# Patient Record
Sex: Male | Born: 1995 | Race: White | Hispanic: No | Marital: Single | Smoking: Current some day smoker
Health system: Southern US, Community
[De-identification: ages and names within clinical notes are randomized; demographics above are authoritative.]

## PROBLEM LIST (undated history)

## (undated) HISTORY — PX: TONSILLECTOMY: SUR1361

---
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
## 2014-01-05 ENCOUNTER — Emergency Department: Payer: Self-pay | Admitting: Emergency Medicine

## 2015-04-09 ENCOUNTER — Emergency Department
Admission: EM | Admit: 2015-04-09 | Discharge: 2015-04-09 | Disposition: A | Discharge status: Home / Self Care | Payer: 59 | Attending: Emergency Medicine | Admitting: Emergency Medicine

## 2015-04-09 ENCOUNTER — Encounter: Payer: Self-pay | Admitting: Emergency Medicine

## 2015-04-09 DIAGNOSIS — Z720 Tobacco use: Secondary | ICD-10-CM | POA: Insufficient documentation

## 2015-04-09 DIAGNOSIS — Y998 Other external cause status: Secondary | ICD-10-CM | POA: Insufficient documentation

## 2015-04-09 DIAGNOSIS — Y9289 Other specified places as the place of occurrence of the external cause: Secondary | ICD-10-CM | POA: Insufficient documentation

## 2015-04-09 DIAGNOSIS — X58XXXA Exposure to other specified factors, initial encounter: Secondary | ICD-10-CM | POA: Insufficient documentation

## 2015-04-09 DIAGNOSIS — S39012A Strain of muscle, fascia and tendon of lower back, initial encounter: Secondary | ICD-10-CM | POA: Diagnosis not present

## 2015-04-09 DIAGNOSIS — S3992XA Unspecified injury of lower back, initial encounter: Secondary | ICD-10-CM | POA: Diagnosis present

## 2015-04-09 DIAGNOSIS — S60221A Contusion of right hand, initial encounter: Secondary | ICD-10-CM | POA: Insufficient documentation

## 2015-04-09 DIAGNOSIS — Y9389 Activity, other specified: Secondary | ICD-10-CM | POA: Diagnosis not present

## 2015-04-09 MED ORDER — CYCLOBENZAPRINE HCL 5 MG PO TABS: 5.0000 mg | ORAL_TABLET | Freq: Three times a day (TID) | ORAL | Status: DC | PRN

## 2015-04-09 MED ORDER — IBUPROFEN 800 MG PO TABS: 800.0000 mg | ORAL_TABLET | Freq: Three times a day (TID) | ORAL | Status: DC | PRN

## 2015-04-09 NOTE — ED Notes (Signed)
States his dog was chewing through dry wall and he was bending over to whip dog and hurt back and right hand

## 2015-04-09 NOTE — ED Provider Notes (Signed)
[facility] Emergency Department Provider Note  ____________________________________________  Time seen: Approximately 11:13 AM  I have reviewed the triage vital signs and the nursing notes.   HISTORY  Chief Complaint Back Pain   HPI Billy Schmitt is a 19 y.o. male who presents to the emergency department for evaluation of right mid to lower back pain and right hand pain. He states his dog chewed through some drywall and chewed up his Xbox while he was gone, which made him mad and he whipped the dog. He did not take anything for pain prior to coming to the emergency department he states that as soon as he felt the pain he just decided to come here.   History reviewed. No pertinent past medical history.  There are no active problems to display for this patient.   Past Surgical History  Procedure Laterality Date  . Tonsillectomy      Current Outpatient Rx  Name  Route  Sig  Dispense  Refill  . cyclobenzaprine (FLEXERIL) 5 MG tablet   Oral   Take 1 tablet (5 mg total) by mouth 3 (three) times daily as needed for muscle spasms.   30 tablet   0   . ibuprofen (ADVIL,MOTRIN) 800 MG tablet   Oral   Take 1 tablet (800 mg total) by mouth every 8 (eight) hours as needed.   30 tablet   0     Allergies Review of patient's allergies indicates no known allergies.  History reviewed. No pertinent family history.  Social History Social History  Substance Use Topics  . Smoking status: Current Some Day Smoker  . Smokeless tobacco: None  . Alcohol Use: No    Review of Systems Constitutional: No recent illness. Eyes: No visual changes. ENT: No sore throat. Cardiovascular: Denies chest pain or palpitations. Respiratory: Denies shortness of breath. Gastrointestinal: No abdominal pain.  Genitourinary: Negative for dysuria. Musculoskeletal: Pain in right hand and right lower back. Skin: Negative for rash. Neurological: Negative for headaches, focal  weakness or numbness. 10-point ROS otherwise negative.  ____________________________________________   PHYSICAL EXAM:  VITAL SIGNS: ED Triage Vitals  Enc Vitals Group     BP 04/09/15 1019 112/78 mmHg     Pulse Rate 04/09/15 1019 98     Resp 04/09/15 1019 18     Temp 04/09/15 1019 98.3 F (36.8 C)     Temp Source 04/09/15 1019 Oral     SpO2 04/09/15 1019 100 %     Weight 04/09/15 1019 140 lb (63.504 kg)     Height 04/09/15 1019  (1.702 m)     Head Cir --      Peak Flow --      Pain Score 04/09/15 1017 7     Pain Loc --      Pain Edu? --      Excl. in GC? --     Constitutional: Alert and oriented. Well appearing and in no acute distress. Eyes: Conjunctivae are normal. EOMI. Head: Atraumatic. Nose: No congestion/rhinnorhea. Neck: No stridor.  Respiratory: Normal respiratory effort.   Musculoskeletal: Full range of motion of right hand. No deformity noted. Tenderness over the right flank area with palpation. Neurologic:  Normal speech and language. No gross focal neurologic deficits are appreciated. Speech is normal. No gait instability. Skin:  Skin is warm, dry and intact. Atraumatic. Psychiatric: Mood and affect are normal. Speech and behavior are normal.  ____________________________________________   LABS (all labs ordered are listed, but only  abnormal results are displayed)  Labs Reviewed - No data to display ____________________________________________  RADIOLOGY  Not indicated ____________________________________________   PROCEDURES  Procedure(s) performed: None   ____________________________________________   INITIAL IMPRESSION / ASSESSMENT AND PLAN / ED COURSE  Pertinent labs & imaging results that were available during my care of the patient were reviewed by me and considered in my medical decision making (see chart for details).  Patient was advised to follow-up with orthopedics for symptoms that are not improving over the week. He was  advised to return to the emergency department for symptoms that change or worsen if he is unable schedule an appointment. ____________________________________________   FINAL CLINICAL IMPRESSION(S) / ED DIAGNOSES  Final diagnoses:  Low back strain, initial encounter  Hand contusion, right, initial encounter       Chinita Pester, FNP 04/09/15 1117  Governor Rooks, MD 04/09/15 1359

## 2015-10-15 ENCOUNTER — Encounter: Payer: Self-pay | Admitting: Emergency Medicine

## 2015-10-15 ENCOUNTER — Emergency Department
Admission: EM | Admit: 2015-10-15 | Discharge: 2015-10-15 | Disposition: A | Discharge status: Home / Self Care | Payer: 59 | Attending: Emergency Medicine | Admitting: Emergency Medicine

## 2015-10-15 DIAGNOSIS — Y998 Other external cause status: Secondary | ICD-10-CM | POA: Insufficient documentation

## 2015-10-15 DIAGNOSIS — S70361A Insect bite (nonvenomous), right thigh, initial encounter: Secondary | ICD-10-CM | POA: Insufficient documentation

## 2015-10-15 DIAGNOSIS — Y9289 Other specified places as the place of occurrence of the external cause: Secondary | ICD-10-CM | POA: Diagnosis not present

## 2015-10-15 DIAGNOSIS — F17200 Nicotine dependence, unspecified, uncomplicated: Secondary | ICD-10-CM | POA: Diagnosis not present

## 2015-10-15 DIAGNOSIS — W57XXXA Bitten or stung by nonvenomous insect and other nonvenomous arthropods, initial encounter: Secondary | ICD-10-CM | POA: Insufficient documentation

## 2015-10-15 DIAGNOSIS — Y9389 Activity, other specified: Secondary | ICD-10-CM | POA: Insufficient documentation

## 2015-10-15 MED ORDER — DOXYCYCLINE HYCLATE 100 MG PO TBEC: 100.0000 mg | DELAYED_RELEASE_TABLET | Freq: Two times a day (BID) | ORAL | Status: AC

## 2015-10-15 NOTE — ED Notes (Signed)
t to ed with c/o tick bite to upper right thigh,  Pt states he removed tick from leg about 4 days ago.  Pt now reports rash to upper right thigh.

## 2015-10-15 NOTE — ED Provider Notes (Signed)
[facility] Emergency Department Provider Note  ____________________________________________  Time seen: Approximately 1:55 PM  I have reviewed the triage vital signs and the nursing notes.   HISTORY  Chief Complaint Insect Bite    HPI Billy Schmitt is a 20 y.o. male , NAD, presents to the emergency department with rash and tick bite to the upper right thigh. States he was helping a friend clear some debris outside. States he removed 3 ticks that were mobile and unattached on the same day then removed and attached tick the morning after from the right upper thigh. Notes there was a large area of erythema approximately 6 inches surrounding the tick bite which is now decreased to her smaller 3 cm area of increased erythema. Denies pain, oozing, weeping, skin sores. Does have a punctate lesion centrally from where he removed the tick. States he removed the entire body and head of the tick from the area. Notes he did have sweats over the last 2 nights but denies fever, chills, body aches, nausea, vomiting, abdominal pain, visual changes, headache. Has noted no other rash about his body.   History reviewed. No pertinent past medical history.  There are no active problems to display for this patient.   Past Surgical History  Procedure Laterality Date  . Tonsillectomy      Current Outpatient Rx  Name  Route  Sig  Dispense  Refill  . cyclobenzaprine (FLEXERIL) 5 MG tablet   Oral   Take 1 tablet (5 mg total) by mouth 3 (three) times daily as needed for muscle spasms.   30 tablet   0   . doxycycline (DORYX) 100 MG EC tablet   Oral   Take 1 tablet (100 mg total) by mouth 2 (two) times daily.   20 tablet   0   . ibuprofen (ADVIL,MOTRIN) 800 MG tablet   Oral   Take 1 tablet (800 mg total) by mouth every 8 (eight) hours as needed.   30 tablet   0     Allergies Review of patient's allergies indicates no known allergies.  History reviewed. No pertinent  family history.  Social History Social History  Substance Use Topics  . Smoking status: Current Some Day Smoker  . Smokeless tobacco: None  . Alcohol Use: No     Review of Systems  Constitutional: Positive sweats. No fever/chills Cardiovascular: No chest pain. Respiratory: No cough. No shortness of breath. No wheezing.  Gastrointestinal: No abdominal pain.  No nausea, vomiting.   Musculoskeletal: Negative for myalgias.  Skin: Positive rash with central punctate lesion. Negative for skin sores or open wounds. Neurological: Negative for headaches, focal weakness or numbness. 10-point ROS otherwise negative.  ____________________________________________   PHYSICAL EXAM:  VITAL SIGNS: ED Triage Vitals  Enc Vitals Group     BP 10/15/15 1245 147/58 mmHg     Pulse Rate 10/15/15 1245 74     Resp 10/15/15 1245 20     Temp 10/15/15 1245 98.2 F (36.8 C)     Temp Source 10/15/15 1245 Oral     SpO2 10/15/15 1245 100 %     Weight 10/15/15 1245 135 lb (61.236 kg)     Height 10/15/15 1245  (1.778 m)     Head Cir --      Peak Flow --      Pain Score 10/15/15 1245 0     Pain Loc --      Pain Edu? --  Excl. in GC? --     Constitutional: Alert and oriented. Well appearing and in no acute distress. Eyes: Conjunctivae are normal. PERRL. EOMI without pain.  Head: Atraumatic.  Neck: Supple with full range of motion Hematological/Lymphatic/Immunilogical: No cervical lymphadenopathy. No inguinal lymphadenopathy. Cardiovascular: Normal rate, regular rhythm. Normal S1 and S2.  Good peripheral circulation. Respiratory: Normal respiratory effort without tachypnea or retractions. Lungs CTAB. Musculoskeletal: No lower extremity tenderness nor edema.   Neurologic:  Normal speech and language. No gross focal neurologic deficits are appreciated.  Skin:  Flat papular erythematous rash surrounding punctate lesion of the right proximal anterior thigh. Rash is nonblanching. Punctate  central lesion is scabbed over. No evidence of foreign body. No induration. No fluctuance, oozing, weeping.  Psychiatric: Mood and affect are normal. Speech and behavior are normal. Patient exhibits appropriate insight and judgement.   ____________________________________________   LABS  None  ___________________________________________  EKG  None ____________________________________________  RADIOLOGY  None  ____________________________________________    PROCEDURES  Procedure(s) performed: None    Medications - No data to display   ____________________________________________   INITIAL IMPRESSION / ASSESSMENT AND PLAN / ED COURSE  Patient's diagnosis is consistent with tick bite of right thigh with surrounding rash. Patient will be discharged home with prescriptions for doxycycline to take as directed to prophylactically treat for potential tick borne exposure/illness. Patient is to [facility] if symptoms persist past this treatment course. Patient is given ED precautions to return to the ED for any worsening or new symptoms.    ____________________________________________  FINAL CLINICAL IMPRESSION(S) / ED DIAGNOSES  Final diagnoses:  Tick bite of right thigh, initial encounter      NEW MEDICATIONS STARTED DURING THIS VISIT:  Discharge Medication List as of 10/15/2015  1:57 PM    START taking these medications   Details  doxycycline (DORYX) 100 MG EC tablet Take 1 tablet (100 mg total) by mouth 2 (two) times daily., Starting 10/15/2015, Until Tue 10/25/15, Print             Ernestene KielJami L MarrowstoneHagler, PA-C 10/15/15 1450  Sharman CheekPhillip Stafford, MD 10/15/15 [id number]

## 2015-10-15 NOTE — Discharge Instructions (Signed)
Tick Bite Information Ticks are insects that attach themselves to the skin and draw blood for food. There are various types of ticks. Common types include wood ticks and deer ticks. Most ticks live in shrubs and grassy areas. Ticks can climb onto your body when you make contact with leaves or grass where the tick is waiting. The most common places on the body for ticks to attach themselves are the scalp, neck, armpits, waist, and groin. Most tick bites are harmless, but sometimes ticks carry germs that cause diseases. These germs can be spread to a person during the tick's feeding process. The chance of a disease spreading through a tick bite depends on:   The type of tick.  Time of year.   How long the tick is attached.   Geographic location.  HOW CAN YOU PREVENT TICK BITES? Take these steps to help prevent tick bites when you are outdoors:  Wear protective clothing. Long sleeves and long pants are best.   Wear white clothes so you can see ticks more easily.  Tuck your pant legs into your socks.   If walking on a trail, stay in the middle of the trail to avoid brushing against bushes.  Avoid walking through areas with long grass.  Put insect repellent on all exposed skin and along boot tops, pant legs, and sleeve cuffs.   Check clothing, hair, and skin repeatedly and before going inside.   Brush off any ticks that are not attached.  Take a shower or bath as soon as possible after being outdoors.  WHAT IS THE PROPER WAY TO REMOVE A TICK? Ticks should be removed as soon as possible to help prevent diseases caused by tick bites. 1. If latex gloves are available, put them on before trying to remove a tick.  2. Using fine-point tweezers, grasp the tick as close to the skin as possible. You may also use curved forceps or a tick removal tool. Grasp the tick as close to its head as possible. Avoid grasping the tick on its body. 3. Pull gently with steady upward pressure until  the tick lets go. Do not twist the tick or jerk it suddenly. This may break off the tick's head or mouth parts. 4. Do not squeeze or crush the tick's body. This could force disease-carrying fluids from the tick into your body.  5. After the tick is removed, wash the bite area and your hands with soap and water or other disinfectant such as alcohol. 6. Apply a small amount of antiseptic cream or ointment to the bite site.  7. Wash and disinfect any instruments that were used.  Do not try to remove a tick by applying a hot match, petroleum jelly, or fingernail polish to the tick. These methods do not work and may increase the chances of disease being spread from the tick bite.  WHEN SHOULD YOU SEEK MEDICAL CARE? Contact your health care provider if you are unable to remove a tick from your skin or if a part of the tick breaks off and is stuck in the skin.  After a tick bite, you need to be aware of signs and symptoms that could be related to diseases spread by ticks. Contact your health care provider if you develop any of the following in the days or weeks after the tick bite:  Unexplained fever.  Rash. A circular rash that appears days or weeks after the tick bite may indicate the possibility of Lyme disease. The rash may resemble   a target with a bull's-eye and may occur at a different part of your body than the tick bite.  Redness and swelling in the area of the tick bite.   Tender, swollen lymph glands.   Diarrhea.   Weight loss.   Cough.   Fatigue.   Muscle, joint, or bone pain.   Abdominal pain.   Headache.   Lethargy or a change in your level of consciousness.  Difficulty walking or moving your legs.   Numbness in the legs.   Paralysis.  Shortness of breath.   Confusion.   Repeated vomiting.    This information is not intended to replace advice given to you by your health care provider. Make sure you discuss any questions you have with your health  care provider.   Document Released: 07/27/2000 Document Revised: 08/20/2014 Document Reviewed: 01/07/2013 Elsevier Interactive Patient Education 2016 Elsevier Inc.  

## 2016-01-01 ENCOUNTER — Encounter: Payer: Self-pay | Admitting: *Deleted

## 2016-01-01 ENCOUNTER — Emergency Department
Admission: EM | Admit: 2016-01-01 | Discharge: 2016-01-01 | Disposition: A | Discharge status: Home / Self Care | Payer: 59 | Attending: Emergency Medicine | Admitting: Emergency Medicine

## 2016-01-01 DIAGNOSIS — F17200 Nicotine dependence, unspecified, uncomplicated: Secondary | ICD-10-CM | POA: Diagnosis not present

## 2016-01-01 DIAGNOSIS — A6002 Herpesviral infection of other male genital organs: Secondary | ICD-10-CM | POA: Diagnosis not present

## 2016-01-01 DIAGNOSIS — A6000 Herpesviral infection of urogenital system, unspecified: Secondary | ICD-10-CM

## 2016-01-01 DIAGNOSIS — R21 Rash and other nonspecific skin eruption: Secondary | ICD-10-CM | POA: Diagnosis present

## 2016-01-01 LAB — URINALYSIS COMPLETE WITH MICROSCOPIC (ARMC ONLY)
Bilirubin Urine: NEGATIVE
GLUCOSE, UA: NEGATIVE mg/dL
Hgb urine dipstick: NEGATIVE
Ketones, ur: NEGATIVE mg/dL
Leukocytes, UA: NEGATIVE
Nitrite: NEGATIVE
PROTEIN: NEGATIVE mg/dL
SQUAMOUS EPITHELIAL / LPF: NONE SEEN
Specific Gravity, Urine: 1.014 (ref 1.005–1.030)
pH: 7 (ref 5.0–8.0)

## 2016-01-01 LAB — CHLAMYDIA/NGC RT PCR (ARMC ONLY)
CHLAMYDIA TR: NOT DETECTED
N gonorrhoeae: NOT DETECTED

## 2016-01-01 MED ORDER — LIDOCAINE HCL 2 % EX GEL
1.0000 "application " | Freq: Once | CUTANEOUS | Status: AC
  Administered 2016-01-01: 1 via TOPICAL

## 2016-01-01 MED ORDER — LIDOCAINE HCL 2 % EX GEL
CUTANEOUS | Status: AC
  Administered 2016-01-01: 1 via TOPICAL
  Filled 2016-01-01: qty 10

## 2016-01-01 MED ORDER — ACYCLOVIR 400 MG PO TABS: 400.0000 mg | ORAL_TABLET | Freq: Three times a day (TID) | ORAL | Status: AC

## 2016-01-01 MED ORDER — ACYCLOVIR 200 MG PO CAPS
400.0000 mg | ORAL_CAPSULE | Freq: Once | ORAL | Status: AC
  Administered 2016-01-01: 400 mg via ORAL
  Filled 2016-01-01: qty 2

## 2016-01-01 MED ORDER — LIDOCAINE 2 % EX GEL: 2.0000 mL | Freq: Two times a day (BID) | CUTANEOUS | Status: AC

## 2016-01-01 NOTE — Discharge Instructions (Signed)

## 2016-01-01 NOTE — ED Provider Notes (Signed)
[facility] Emergency Department Provider Note ____________________________________________  Time seen: [id number]  I have reviewed the triage vital signs and the nursing notes.  HISTORY  Chief Complaint  Rash  HPI Billy Schmitt is a 20 y.o. male visits to the ED for evaluation of painful sores to his penis the last 3-4 days. Patient describes discomfort about a day before the onset of the sores and lesions that he finds circumferentially around the distal shaft. He does admit to an unprotected single sexual encounter about 2 months prior. This was not with his current girlfriend. He had an apparent sexual encounter with his current girlfriend a day before the lesions appeared. He denies any interim fevers, chills, sweats.He reports some mild dysuria but denies any penile discharge, hematuria, or flank pain. He does note some tenderness to the groin bilaterally. He denies any previous STD exposure.  History reviewed. No pertinent past medical history.  There are no active problems to display for this patient.   Past Surgical History  Procedure Laterality Date  . Tonsillectomy      Current Outpatient Rx  Name  Route  Sig  Dispense  Refill  . acyclovir (ZOVIRAX) 400 MG tablet   Oral   Take 1 tablet (400 mg total) by mouth 3 (three) times daily.   30 tablet   0   . cyclobenzaprine (FLEXERIL) 5 MG tablet   Oral   Take 1 tablet (5 mg total) by mouth 3 (three) times daily as needed for muscle spasms.   30 tablet   0   . ibuprofen (ADVIL,MOTRIN) 800 MG tablet   Oral   Take 1 tablet (800 mg total) by mouth every 8 (eight) hours as needed.   30 tablet   0   . Lidocaine 2 % GEL   Apply externally   Apply 2 mLs topically 2 (two) times daily.   28.33 g   0    Allergies Review of patient's allergies indicates no known allergies.  No family history on file.  Social History Social History  Substance Use Topics  . Smoking status: Current Some Day Smoker   . Smokeless tobacco: None  . Alcohol Use: No   Review of Systems  Constitutional: Negative for fever.  Gastrointestinal: Negative for abdominal pain, vomiting and diarrhea. Genitourinary: Positive for dysuria. Penile lesions as above Musculoskeletal: Negative for back pain. Skin: Negative for rash. Neurological: Negative for headaches, focal weakness or numbness. ____________________________________________  PHYSICAL EXAM:  VITAL SIGNS: ED Triage Vitals  Enc Vitals Group     BP --      Pulse --      Resp --      Temp --      Temp src --      SpO2 --      Weight --      Height --      Head Cir --      Peak Flow --      Pain Score --      Pain Loc --      Pain Edu? --      Excl. in GC? --    Constitutional: Alert and oriented. Well appearing and in no distress. Head: Normocephalic and atraumatic. Hematological/Lymphatic/Immunological: Palpable inguinal lymphadenopathy, bilaterally. Respiratory: Normal respiratory effort.  Gastrointestinal: Soft and nontender. No distention. GU: Normal external genitalia. Circumcised male. Multiple ulcerated lesions on an erythematous base around the distal shaft, below the corona.  Musculoskeletal: Nontender with normal range of motion  in all extremities.  Skin:  Skin is warm, dry and intact. No rash noted. ____________________________________________   LABS (pertinent positives/negatives)  Labs Reviewed  URINALYSIS COMPLETEWITH MICROSCOPIC ([facility] ONLY) - Abnormal; Notable for the following:    Color, Urine YELLOW (*)    APPearance CLOUDY (*)    Bacteria, UA RARE (*)    All other components within normal limits  CHLAMYDIA/NGC RT PCR ([facility] ONLY)  ____________________________________________  PROCEDURES  Acyclovir 400 mg PO Viscous lidocaine 2% jelly topically ____________________________________________  INITIAL IMPRESSION / ASSESSMENT AND PLAN / ED COURSE  Patient with an initial outbreak of herpes genitalia.  He'll be discharged with prescriptions for acyclovir dose as directed. He is referred to the [facility] health Department STD clinic for further evaluation and testing including HIV testing. ____________________________________________  FINAL CLINICAL IMPRESSION(S) / ED DIAGNOSES  Final diagnoses:  Herpes genitalia     Lissa Hoard, PA-C 01/05/16 1735  Arnaldo Natal, MD 01/08/16 [phone]

## 2016-01-01 NOTE — ED Notes (Signed)
Patient states he noticed sores on his penis 3-4 days ago, reports some pain with urination. Patient reports having unprotected sex recently.

## 2016-01-01 NOTE — ED Notes (Signed)
Deferred physical assessment to PA. Patient appears anxious and uncomfortable.

## 2016-01-01 NOTE — ED Notes (Signed)
NAD noted at time of D/C. Pt denies questions or concerns. Pt ambulatory to the lobby at this time.  

## 2016-05-07 ENCOUNTER — Emergency Department
Admission: EM | Admit: 2016-05-07 | Discharge: 2016-05-07 | Disposition: A | Discharge status: Home / Self Care | Payer: 59 | Attending: Emergency Medicine | Admitting: Emergency Medicine

## 2016-05-07 ENCOUNTER — Encounter: Payer: Self-pay | Admitting: Emergency Medicine

## 2016-05-07 DIAGNOSIS — Y9389 Activity, other specified: Secondary | ICD-10-CM | POA: Insufficient documentation

## 2016-05-07 DIAGNOSIS — S0990XA Unspecified injury of head, initial encounter: Secondary | ICD-10-CM

## 2016-05-07 DIAGNOSIS — Y929 Unspecified place or not applicable: Secondary | ICD-10-CM | POA: Diagnosis not present

## 2016-05-07 DIAGNOSIS — F17200 Nicotine dependence, unspecified, uncomplicated: Secondary | ICD-10-CM | POA: Diagnosis not present

## 2016-05-07 DIAGNOSIS — W01198A Fall on same level from slipping, tripping and stumbling with subsequent striking against other object, initial encounter: Secondary | ICD-10-CM | POA: Diagnosis not present

## 2016-05-07 DIAGNOSIS — Y999 Unspecified external cause status: Secondary | ICD-10-CM | POA: Insufficient documentation

## 2016-05-07 DIAGNOSIS — S0191XA Laceration without foreign body of unspecified part of head, initial encounter: Secondary | ICD-10-CM | POA: Insufficient documentation

## 2016-05-07 DIAGNOSIS — Z791 Long term (current) use of non-steroidal anti-inflammatories (NSAID): Secondary | ICD-10-CM | POA: Diagnosis not present

## 2016-05-07 NOTE — ED Provider Notes (Signed)
[facility] Emergency Department Provider Note  ____________________________________________  Time seen: 1720  I have reviewed the triage vital signs and the nursing notes.   HISTORY  Chief Complaint Head Injury  HPI Billy Schmitt is a 20 y.o. male who presents to the emergency department for evaluation of a head injury he sustained while at work. 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 He denies loss of consciousness. He states that he did not actually strike his head on the floor.   History reviewed. No pertinent past medical history.  There are no active problems to display for this patient.   Past Surgical History:  Procedure Laterality Date  . TONSILLECTOMY      Current Outpatient Rx  . Order #: [id number] Class: Print  . Order #: [id number] Class: Print    Allergies Review of patient's allergies indicates no known allergies.  No family history on file.  Social History Social History  Substance Use Topics  . Smoking status: Current Some Day Smoker  . Smokeless tobacco: Never Used  . Alcohol use No    Review of Systems  Constitutional: No loss of consciousness, no fever. HEENT: Negative for decrease in hearing, negative for epistaxis, negative for change in vision. Musculoskeletal: Negative for neck or back pain. Negative for extremity pain. Skin: Positive for laceration behind the left ear. ____________________________________________   PHYSICAL EXAM:  Constitutional: Well appearing and in no distress. HEENT: Head is atraumatic with the exception of small laceration behind the left ear: Bilateral tympanic membranes are within normal limits. No epistaxis. Musculoskeletal: Nexus criteria is negative. Full range of motion of extremities. No midline tenderness throughout the spine. Skin: 1 cm laceration behind the left ear. Bleeding well  controlled.  VITAL SIGNS: ED Triage Vitals  Enc Vitals Group     BP 05/07/16 1648 (!) 115/58     Pulse Rate 05/07/16 1648 (!) 57     Resp 05/07/16 1648 18     Temp 05/07/16 1648 98.1 F (36.7 C)     Temp Source 05/07/16 1648 Oral     SpO2 05/07/16 1648 98 %     Weight 05/07/16 1649 135 lb (61.2 kg)     Height 05/07/16 1649 5\' 9"  (1.753 m)     Head Circumference --      Peak Flow --      Pain Score 05/07/16 1658 4     Pain Loc --      Pain Edu? --      Excl. in GC? --      ____________________________________________   EKG    ____________________________________________    RADIOLOGY  Not indicated  ____________________________________________   PROCEDURES  Procedure(s) performed:  LACERATION REPAIR Performed by: Kem Boroughs  Consent: Verbal consent obtained.  Consent given by: patient  Prepped and Draped in normal sterile fashion  Wound explored: No foreign bodies  Laceration Location: Near skin fold of left ear  Laceration Length: 1 cm  Anesthesia: None  Amount of cleaning: Standard  Skin closure: Skin adhesive  Technique: Topical  Patient tolerance: Patient tolerated the procedure well with no immediate complications. __________________________________________   INITIAL IMPRESSION / ASSESSMENT AND PLAN / ED COURSE  Pertinent labs & imaging results that were available during my care of the patient were reviewed by me and considered in my medical decision making (see chart for details).  Wound care  instruction discussed. Head injury instructions discussed.  Patient was advised to follow up with the PCP of his choice for concerns or return to the emergency department.  ____________________________________________   FINAL CLINICAL IMPRESSION(S) / ED DIAGNOSES  Final diagnoses:  Minor head injury, initial encounter  Laceration of head, initial encounter      Chinita PesterCari B Dalisha Shively, FNP 05/07/16 2215    Loleta Roseory Forbach, MD 05/07/16  2320

## 2016-05-07 NOTE — ED Triage Notes (Signed)
Slipped on wet floor at work, hit head on faucet.  Noticed small amount of bleeding.  Here to get checked out.  Denies LOC

## 2016-11-19 ENCOUNTER — Emergency Department
Admission: EM | Admit: 2016-11-19 | Discharge: 2016-11-19 | Disposition: A | Discharge status: Home / Self Care | Payer: 59 | Attending: Emergency Medicine | Admitting: Emergency Medicine

## 2016-11-19 DIAGNOSIS — Y939 Activity, unspecified: Secondary | ICD-10-CM | POA: Insufficient documentation

## 2016-11-19 DIAGNOSIS — Y929 Unspecified place or not applicable: Secondary | ICD-10-CM | POA: Insufficient documentation

## 2016-11-19 DIAGNOSIS — W57XXXA Bitten or stung by nonvenomous insect and other nonvenomous arthropods, initial encounter: Secondary | ICD-10-CM | POA: Insufficient documentation

## 2016-11-19 DIAGNOSIS — Y999 Unspecified external cause status: Secondary | ICD-10-CM | POA: Insufficient documentation

## 2016-11-19 DIAGNOSIS — T07XXXA Unspecified multiple injuries, initial encounter: Secondary | ICD-10-CM | POA: Insufficient documentation

## 2016-11-19 DIAGNOSIS — F17200 Nicotine dependence, unspecified, uncomplicated: Secondary | ICD-10-CM | POA: Insufficient documentation

## 2016-11-19 MED ORDER — DOXYCYCLINE HYCLATE 100 MG PO CAPS: 100.0000 mg | ORAL_CAPSULE | Freq: Two times a day (BID) | ORAL | 0 refills | Status: DC

## 2016-11-19 NOTE — ED Triage Notes (Signed)
Pt states he was [place] in the woods on Friday and over the course of 24hrs he removed 14 ticks, states since yesterday is having nausea with ringing in the ears.Billy Schmitt pain or HA, fever or rash.

## 2016-11-19 NOTE — ED Provider Notes (Signed)
[facility] Emergency Department Provider Note  ____________________________________________   First MD Initiated Contact with Patient 11/19/16 1518     (approximate)  I have reviewed the triage vital signs and the nursing notes.   HISTORY  Chief Complaint Insect Bite    HPI Billy Schmitt is a 21 y.o. male is here after pulling approximate 14 takes off of his body in last 24 hours. Patient states last year he also had multiple tick bites. He was placed on doxycycline without any difficulty. Currently he states he had nausea yesterday with ringing in his ears. He denies any fever or chills. He denies any headache or muscle aches at this time. Patient had tick bites last year and reports that he had a bull's-eye last year and was placed on doxycycline.   History reviewed. No pertinent past medical history.  There are no active problems to display for this patient.   Past Surgical History:  Procedure Laterality Date  . TONSILLECTOMY      Prior to Admission medications   Medication Sig Start Date End Date Taking? Authorizing Provider  doxycycline (VIBRAMYCIN) 100 MG capsule Take 1 capsule (100 mg total) by mouth 2 (two) times daily. 11/19/16   Tommi Rumps, PA-C    Allergies Patient has no known allergies.  No family history on file.  Social History Social History  Substance Use Topics  . Smoking status: Current Some Day Smoker  . Smokeless tobacco: Never Used  . Alcohol use No    Review of Systems Constitutional: No fever/chills ENT: No sore throat. Positive ringing in the ears. Cardiovascular: Denies chest pain. Respiratory: Denies shortness of breath. Gastrointestinal: No abdominal pain.  No nausea, no vomiting.   Musculoskeletal: Negative for back pain. Skin: Positive for multiple tick bites. Neurological: Negative for headaches, focal weakness or numbness.  10-point ROS otherwise  negative.  ____________________________________________   PHYSICAL EXAM:  VITAL SIGNS: ED Triage Vitals [11/19/16 1442]  Enc Vitals Group     BP 139/88     Pulse Rate 77     Resp 18     Temp 98.3 F (36.8 C)     Temp Source Oral     SpO2 99 %     Weight 130 lb (59 kg)     Height  (1.778 m)     Head Circumference      Peak Flow      Pain Score      Pain Loc      Pain Edu?      Excl. in GC?     Constitutional: Alert and oriented. Well appearing and in no acute distress. Eyes: Conjunctivae are normal. PERRL. EOMI. Head: Atraumatic. Nose: No congestion/rhinnorhea.  EAC's with cerumen. Neck: No stridor.   Hematological/Lymphatic/Immunilogical: No cervical lymphadenopathy. Cardiovascular: Normal rate, regular rhythm. Grossly normal heart sounds.  Good peripheral circulation. Respiratory: Normal respiratory effort.  No retractions. Lungs CTAB. Musculoskeletal: His upper and lower extremities without any difficulty. No edema noted. Neurologic:  Normal speech and language. No gross focal neurologic deficits are appreciated. No gait instability. Skin:  Skin is warm, dry and intact. Multiple tick bites with erythema noted but no actual bull's-eye is seen. Psychiatric: Mood and affect are normal. Speech and behavior are normal.  ____________________________________________   LABS (all labs ordered are listed, but only abnormal results are displayed)  Labs Reviewed - No data to display  PROCEDURES  Procedure(s) performed: None  Procedures  Critical Care performed: No  ____________________________________________   INITIAL IMPRESSION / ASSESSMENT AND PLAN / ED COURSE  Pertinent labs & imaging results that were available during my care of the patient were reviewed by me and considered in my medical decision making (see chart for details).  Patient does not have PCP to follow-up with. He was given a prescription for doxycycline. He is to follow-up with [facility]  clinic acute-care if any problems. Patient is aware that he should not be out in bright sunlight because of the antibiotic and possibility of blistering.      ____________________________________________   FINAL CLINICAL IMPRESSION(S) / ED DIAGNOSES  Final diagnoses:  Tick bite, initial encounter      NEW MEDICATIONS STARTED DURING THIS VISIT:  Discharge Medication List as of 11/19/2016  3:45 PM    START taking these medications   Details  doxycycline (VIBRAMYCIN) 100 MG capsule Take 1 capsule (100 mg total) by mouth 2 (two) times daily., Starting Mon 11/19/2016, Print         Note:  This document was prepared using Dragon voice recognition software and may include unintentional dictation errors.    Tommi Rumps, PA-C 11/19/16 1555    Tommi Rumps, PA-C 11/19/16 1556    Emily Filbert, MD 11/20/16 [phone]

## 2016-11-19 NOTE — ED Notes (Signed)
See triage note  States he found several ticks on him a few days ago  Now having ringing in ears and some nausea  Afebrile on arrival

## 2016-11-19 NOTE — Discharge Instructions (Signed)
Follow-up with [facility] clinic acute-care if any continued problems. Begin taking doxycycline as directed twice a day for the next 7 days. Be aware that you cannot be out in bright sunlight with this medication. Consider wearing Deet to discourage ticks .

## 2018-02-07 ENCOUNTER — Emergency Department
Admission: EM | Admit: 2018-02-07 | Discharge: 2018-02-08 | Disposition: A | Discharge status: Home / Self Care | Payer: 59 | Attending: Emergency Medicine | Admitting: Emergency Medicine

## 2018-02-07 ENCOUNTER — Other Ambulatory Visit: Payer: Self-pay

## 2018-02-07 DIAGNOSIS — L03031 Cellulitis of right toe: Secondary | ICD-10-CM | POA: Insufficient documentation

## 2018-02-07 DIAGNOSIS — F17200 Nicotine dependence, unspecified, uncomplicated: Secondary | ICD-10-CM | POA: Insufficient documentation

## 2018-02-07 MED ORDER — CEPHALEXIN 500 MG PO CAPS
500.0000 mg | ORAL_CAPSULE | Freq: Once | ORAL | Status: DC
  Filled 2018-02-07: qty 1

## 2018-02-07 MED ORDER — CEPHALEXIN 500 MG PO CAPS: 500.0000 mg | ORAL_CAPSULE | Freq: Two times a day (BID) | ORAL | 0 refills | Status: AC

## 2018-02-07 NOTE — ED Triage Notes (Signed)
Pt arrives to ED via POV with c/o ingrown toenail (1st toe on right foot) x3 days. Pt reports some discharge when he tried to express it with a toothpick.

## 2018-02-07 NOTE — ED Provider Notes (Signed)
[facility] Emergency Department Provider Note    First MD Initiated Contact with Patient 02/07/18 2329     (approximate)  I have reviewed the triage vital signs and the nursing notes.   HISTORY  Chief Complaint Ingrown Toenail    HPI Billy Schmitt is a 22 y.o. male presents to the emergency department with a 3-day history of right great toe "ingrown toenail with associated discomfort and scant purulent drainage.  Patient states that he attempted to express this drainage with a toothpick.  Past medical history None  There are no active problems to display for this patient.   Past Surgical History:  Procedure Laterality Date  . TONSILLECTOMY      Prior to Admission medications   Medication Sig Start Date End Date Taking? Authorizing Provider  doxycycline (VIBRAMYCIN) 100 MG capsule Take 1 capsule (100 mg total) by mouth 2 (two) times daily. 11/19/16   Tommi Rumps, PA-C    Allergies No known drug allergies No family history on file.  Social History Social History   Tobacco Use  . Smoking status: Current Some Day Smoker  . Smokeless tobacco: Never Used  Substance Use Topics  . Alcohol use: No  . Drug use: No    Review of Systems Constitutional: No fever/chills Eyes: No visual changes. ENT: No sore throat. Cardiovascular: Denies chest pain. Respiratory: Denies shortness of breath. Gastrointestinal: No abdominal pain.  No nausea, no vomiting.  No diarrhea.  No constipation. Genitourinary: Negative for dysuria. Musculoskeletal: Negative for neck pain.  Negative for back pain. Integumentary: Negative for rash. Neurological: Negative for headaches, focal weakness or numbness.   ____________________________________________   PHYSICAL EXAM:  VITAL SIGNS: ED Triage Vitals  Enc Vitals Group     BP 02/07/18 2216 112/62     Pulse Rate 02/07/18 2216 67     Resp 02/07/18 2216 17     Temp 02/07/18 2216 98.3 F (36.8 C)   Temp Source 02/07/18 2216 Oral     SpO2 02/07/18 2216 98 %     Weight 02/07/18 2215 56.7 kg (125 lb)     Height 02/07/18 2215 1.753 m (5\' 9" )     Head Circumference --      Peak Flow --      Pain Score 02/07/18 2214 6     Pain Loc --      Pain Edu? --      Excl. in GC? --     Constitutional: Alert and oriented. Well appearing and in no acute distress. Eyes: Conjunctivae are normal.  Mouth/Throat: Mucous membranes are moist Neck: No stridor.   Musculoskeletal: Blanching erythema noted at the nailbed of the right great toe.  No flocculence.  No active drainage. Neurologic:  Normal speech and language. No gross focal neurologic deficits are appreciated.  Skin: Blanching erythema at the base of the right great toe with no associated flocculence or drainage. Psychiatric: Mood and affect are normal. Speech and behavior are normal.  ____________________________________________    __________________  Procedures   ____________________________________________   INITIAL IMPRESSION / ASSESSMENT AND PLAN / ED COURSE  As part of my medical decision making, I reviewed the following data within the electronic MEDICAL RECORD NUMBER  22 year old male presenting with above-stated history and physical exam consistent with a paronychia.  Patient given Keflex in the emergency department will be prescribed the same for home no need for I&D at this time.   ____________________________________________  FINAL CLINICAL IMPRESSION(S) / ED DIAGNOSES  Final diagnoses:  Paronychia of great toe, right     MEDICATIONS GIVEN DURING THIS VISIT:  Medications - No data to display   ED Discharge Orders    None       Note:  This document was prepared using Dragon voice recognition software and may include unintentional dictation errors.    Darci CurrentBrown, [place], MD 02/07/18 [phone]

## 2018-02-08 NOTE — ED Notes (Signed)
RN to bedside to discharge patient. Patient not in room. Patient not in lobby. Patient left prior to reviewing discharge instructions with RN or receiving prescription. Patient had previously discussed discharge instructions with MD Manson PasseyBrown. Patient verbalized understanding of instructions left with MD Manson PasseyBrown.

## 2018-02-08 NOTE — ED Notes (Signed)
RN attempted to call patient unsuccessfully. Patient's voicemail box not set up. No message left. Patient's discharge paper's and prescription left at front desk.

## 2018-03-18 LAB — HM HIV SCREENING LAB: HM HIV Screening: NEGATIVE

## 2019-06-05 ENCOUNTER — Ambulatory Visit: Payer: Self-pay

## 2019-06-23 ENCOUNTER — Encounter: Payer: Self-pay | Admitting: Family Medicine

## 2019-06-23 ENCOUNTER — Ambulatory Visit: Payer: Self-pay | Admitting: Family Medicine

## 2019-06-23 ENCOUNTER — Other Ambulatory Visit: Payer: Self-pay

## 2019-06-23 DIAGNOSIS — N489 Disorder of penis, unspecified: Secondary | ICD-10-CM

## 2019-06-23 DIAGNOSIS — L042 Acute lymphadenitis of upper limb: Secondary | ICD-10-CM

## 2019-06-23 DIAGNOSIS — Z113 Encounter for screening for infections with a predominantly sexual mode of transmission: Secondary | ICD-10-CM

## 2019-06-23 LAB — GRAM STAIN

## 2019-06-23 NOTE — Progress Notes (Signed)
Gram stain reviewed. No tx per SO Billy Mester, RN  

## 2019-06-23 NOTE — Progress Notes (Signed)
    STI clinic/screening visit  Subjective:  Billy Schmitt is a 23 y.o. male being seen today for an STI screening visit. The patient reports they do have symptoms.  Patient has the following medical conditions:  There are no active problems to display for this patient.    No chief complaint on file.   HPI  Patient reports that he has a small bump on his penis that he notice prior to his 11/2018 clinic visit.  States he was told it wasn't HPV and that it should resolve without treatment.  He states that the lesion hasn't changed and wants to be reevluated.  He has had a fever blister on his lower lip that is resolving.  He also has a swollen, tender lymph node under his R axilla x 3-4 day. He believes it is smaller today than when he noted area 3-4 days ago.Marland Kitchen  He took ibuprofen yesterday. Denies fever. Discharge, or redness of area or injuiry.   See flowsheet for further details and programmatic requirements.    The following portions of the patient's history were reviewed and updated as appropriate: allergies, current medications, past medical history, past social history, past surgical history and problem list.  Objective:  There were no vitals filed for this visit.  Physical Exam Constitutional:      Appearance: He is obese.  HENT:     Mouth/Throat:     Mouth: Mucous membranes are moist. Oral lesions present.     Pharynx: Oropharynx is clear. No oropharyngeal exudate or posterior oropharyngeal erythema.     Comments: sm healing area on L lower lip, no disch./redness noted Neck:     Musculoskeletal: Neck supple. No muscular tenderness.  Abdominal:     Palpations: Abdomen is soft.     Tenderness: There is no abdominal tenderness.  Genitourinary:    Penis: Circumcised. No lesions.      Comments: 1-2 mm flesh colored lesion, nontender Lymphadenopathy:     Cervical: No cervical adenopathy.     Upper Body:     Right upper body: Axillary adenopathy present.     Left upper  body: No axillary adenopathy.     Comments: 2 cm x 3 cm fluctuant, mobile mass , non-erythematous, mild tenderness on palpation  Skin:    General: Skin is dry.     Findings: No lesion or rash.  Neurological:     Mental Status: He is alert.    Assessment and Plan:  JODY SILAS is a 23 y.o. male presenting to the [facility] Department for STI screening  1. Screening examination for venereal disease  - Gram stain- negativer - Gonococcus culture - HIV/HCV [place] [facility] - Hepatitis Serology, [place] [facility] - Syphilis Serology, [place] [facility]  2. Abnormality of penis Most likely molluscum- client requested freezing treatment. - Cryotherapy/destruct benign or premalignant lesion Co client to return to clinic for further evaluation as needed.  3.  Acute lymphadenitis of R axilla Co. To take Ibuprofen 600-800 mg po q 8 hrs. And apply warn compress to area.  IF no change in are 48- 72 hours to seek care at urgent care or ED.  No follow-ups on file.  No future appointments.  Hassell Done, FNP

## 2019-06-27 LAB — GONOCOCCUS CULTURE

## 2019-06-28 ENCOUNTER — Other Ambulatory Visit: Payer: Self-pay

## 2019-06-28 ENCOUNTER — Emergency Department: Admission: EM | Admit: 2019-06-28 | Discharge: 2019-06-28 | Discharge status: Against Medical Advice | Payer: Self-pay

## 2019-06-30 LAB — HEPATITIS B SURFACE ANTIGEN

## 2019-07-02 ENCOUNTER — Other Ambulatory Visit: Payer: Self-pay

## 2019-07-02 ENCOUNTER — Emergency Department
Admission: EM | Admit: 2019-07-02 | Discharge: 2019-07-02 | Disposition: A | Discharge status: Home / Self Care | Payer: Self-pay | Attending: Emergency Medicine | Admitting: Emergency Medicine

## 2019-07-02 DIAGNOSIS — F17290 Nicotine dependence, other tobacco product, uncomplicated: Secondary | ICD-10-CM | POA: Insufficient documentation

## 2019-07-02 DIAGNOSIS — L723 Sebaceous cyst: Secondary | ICD-10-CM | POA: Insufficient documentation

## 2019-07-02 LAB — CBC WITH DIFFERENTIAL/PLATELET
Abs Immature Granulocytes: 0.01 10*3/uL (ref 0.00–0.07)
Basophils Absolute: 0.1 10*3/uL (ref 0.0–0.1)
Basophils Relative: 2 %
Eosinophils Absolute: 0.3 10*3/uL (ref 0.0–0.5)
Eosinophils Relative: 4 %
HCT: 44.1 % (ref 39.0–52.0)
Hemoglobin: 15.6 g/dL (ref 13.0–17.0)
Immature Granulocytes: 0 %
Lymphocytes Relative: 42 %
Lymphs Abs: 2.6 10*3/uL (ref 0.7–4.0)
MCH: 29.4 pg (ref 26.0–34.0)
MCHC: 35.4 g/dL (ref 30.0–36.0)
MCV: 83.1 fL (ref 80.0–100.0)
Monocytes Absolute: 0.5 10*3/uL (ref 0.1–1.0)
Monocytes Relative: 7 %
Neutro Abs: 2.7 10*3/uL (ref 1.7–7.7)
Neutrophils Relative %: 45 %
Platelets: 199 10*3/uL (ref 150–400)
RBC: 5.31 MIL/uL (ref 4.22–5.81)
RDW: 12.3 % (ref 11.5–15.5)
WBC: 6.1 10*3/uL (ref 4.0–10.5)
nRBC: 0 % (ref 0.0–0.2)

## 2019-07-02 LAB — BASIC METABOLIC PANEL
Anion gap: 11 (ref 5–15)
BUN: 11 mg/dL (ref 6–20)
CO2: 22 mmol/L (ref 22–32)
Calcium: 9.3 mg/dL (ref 8.9–10.3)
Chloride: 107 mmol/L (ref 98–111)
Creatinine, Ser: 0.88 mg/dL (ref 0.61–1.24)
GFR calc Af Amer: >60 mL/min (ref >60)
GFR calc non Af Amer: >60 mL/min (ref >60)
Glucose, Bld: 102 mg/dL — ABNORMAL HIGH (ref 70–99)
Potassium: 4.4 mmol/L (ref 3.5–5.1)
Sodium: 140 mmol/L (ref 135–145)

## 2019-07-02 NOTE — Discharge Instructions (Addendum)
Follow-up with the surgeon listed on your discharge papers if any continued problems or desire to have it completely removed.  If this area becomes red, hot, painful to touch return to the ED where it can be lanced.

## 2019-07-02 NOTE — ED Notes (Signed)
See triage note  Presents with swelling under right arm for the past 2 weeks

## 2019-07-02 NOTE — ED Provider Notes (Signed)
[facility] Emergency Department Provider Note  ____________________________________________   First MD Initiated Contact with Patient 07/02/19 [phone]     (approximate)  I have reviewed the triage vital signs and the nursing notes.   HISTORY  Chief Complaint Abscess   HPI Billy Schmitt is a 23 y.o. male presents to the ED with complaint of a swollen area under his right arm for the past 2 weeks.  He states he was seen at the health department when he was having STI test and was told that if it did not improve to go to the ED for evaluation.  Patient denies any discoloration, fever, tenderness or drainage.  He rates his pain as a 0/10 at this time.      History reviewed. No pertinent past medical history.  There are no active problems to display for this patient.   Past Surgical History:  Procedure Laterality Date  . TONSILLECTOMY      Prior to Admission medications   Not on File    Allergies Patient has no known allergies.  No family history on file.  Social History Social History   Tobacco Use  . Smoking status: Current Every Day Smoker    Types: E-cigarettes  . Smokeless tobacco: Never Used  Substance Use Topics  . Alcohol use: Yes    Comment: occas.  . Drug use: Yes    Types: Marijuana    Comment: most days    Review of Systems Constitutional: No fever/chills Eyes: No visual changes. ENT: No sore throat. Cardiovascular: Denies chest pain. Respiratory: Denies shortness of breath. Gastrointestinal: No abdominal pain.  No nausea, no vomiting. Musculoskeletal: Negative for back pain. Skin: Positive for "cyst". Neurological: Negative for headaches, focal weakness or numbness.  ____________________________________________   PHYSICAL EXAM:  VITAL SIGNS: ED Triage Vitals  Enc Vitals Group     BP 07/02/19 0801 127/75     Pulse Rate 07/02/19 0801 70     Resp 07/02/19 0801 16     Temp 07/02/19 0801 98.4 F (36.9 C)   Temp Source 07/02/19 0801 Oral     SpO2 07/02/19 0801 99 %     Weight 07/02/19 0758 140 lb (63.5 kg)     Height 07/02/19 0758 5\' 8"  (1.727 m)     Head Circumference --      Peak Flow --      Pain Score 07/02/19 0758 0     Pain Loc --      Pain Edu? --      Excl. in [place]? --    Constitutional: Alert and oriented. Well appearing and in no acute distress. Eyes: Conjunctivae are normal.  Head: Atraumatic. Neck: No stridor.   Cardiovascular: Normal rate, regular rhythm. Grossly normal heart sounds.  Good peripheral circulation. Respiratory: Normal respiratory effort.  No retractions. Lungs CTAB. Musculoskeletal: No lower extremity tenderness nor edema.  No joint effusions. Neurologic:  Normal speech and language. No gross focal neurologic deficits are appreciated. No gait instability. Skin:  Skin is warm, dry and intact.  2 cm flesh-colored, round, discrete margins and nontender is mobile in the right axilla.  No drainage present.  Skin is intact. Psychiatric: Mood and affect are normal. Speech and behavior are normal.  ____________________________________________   LABS (all labs ordered are listed, but only abnormal results are displayed)  Labs Reviewed  BASIC METABOLIC PANEL - Abnormal; Notable for the following components:      Result Value   Glucose, Bld 102 (*)  All other components within normal limits  CBC WITH DIFFERENTIAL/PLATELET     PROCEDURES  Procedure(s) performed (including Critical Care):  Procedures   ____________________________________________   INITIAL IMPRESSION / ASSESSMENT AND PLAN / ED COURSE  As part of my medical decision making, I reviewed the following data within the electronic MEDICAL RECORD NUMBER Notes from prior ED visits and [place] Controlled Substance Database  23 year old male presents to the ED with questionable cyst in his right axilla for the last 2 weeks.  Patient states that it is not changed in color and has not been tender, warm or  draining.  On exam there is a flesh-colored cystic formation that is mobile, nontender and without discoloration.  Patient was made aware that this was a sebaceous cyst most likely but not infected at this time.  He was given information about surgical consult should he wish to have this completely removed.  At this time there is nothing to I&D.  He is to follow-up with his PCP if any continued problems.  ____________________________________________   FINAL CLINICAL IMPRESSION(S) / ED DIAGNOSES  Final diagnoses:  Sebaceous cyst of right axilla     ED Discharge Orders    None       Note:  This document was prepared using Dragon voice recognition software and may include unintentional dictation errors.    Tommi Rumps, PA-C 07/02/19 1559    Sharyn Creamer, MD 07/02/19 [phone]

## 2019-07-02 NOTE — ED Triage Notes (Signed)
Pt c/o swollen tender area to the right axillary area for the past 2 weeks.

## 2019-07-03 LAB — HM HIV SCREENING LAB: HM HIV Screening: NEGATIVE

## 2019-07-03 LAB — HM HEPATITIS C SCREENING LAB: HM Hepatitis Screen: NEGATIVE

## 2019-07-06 ENCOUNTER — Encounter: Payer: Self-pay | Admitting: Emergency Medicine

## 2019-07-06 ENCOUNTER — Ambulatory Visit
Admission: EM | Admit: 2019-07-06 | Discharge: 2019-07-06 | Disposition: A | Discharge status: Home / Self Care | Payer: Self-pay | Attending: Emergency Medicine | Admitting: Emergency Medicine

## 2019-07-06 DIAGNOSIS — J029 Acute pharyngitis, unspecified: Secondary | ICD-10-CM | POA: Insufficient documentation

## 2019-07-06 LAB — POCT RAPID STREP A (OFFICE): Rapid Strep A Screen: NEGATIVE

## 2019-07-06 MED ORDER — IBUPROFEN 800 MG PO TABS: 800.0000 mg | ORAL_TABLET | Freq: Three times a day (TID) | ORAL | 0 refills | Status: DC | PRN

## 2019-07-06 NOTE — Discharge Instructions (Addendum)
Take the ibuprofen as directed.  Go to the emergency department if you have difficulty swallowing or breathing.    Your rapid strep test is negative.  A throat culture is pending; we will call you if it is positive requiring treatment.    Your COVID test is pending.  You should self quarantine until your test result is back and is negative.    Go to the emergency department if you develop high fever, shortness of breath, severe diarrhea, or other concerning symptoms.

## 2019-07-06 NOTE — ED Provider Notes (Signed)
Roderic Palau    CSN: [id number] Arrival date & time: 07/06/19  1344      History   Chief Complaint Chief Complaint  Patient presents with  . Covid test    HPI OJANI BERENSON is a 23 y.o. male.   Patient presents with "uncomfortable feeling" in his throat.  He states it feels like it is swollen.  He denies difficulty swallowing or breathing.  He denies fever, chills, cough, shortness of breath, rash or other symptoms.  No treatments attempted at home.  Patient requests a COVID test.  The history is provided by the patient.    History reviewed. No pertinent past medical history.  There are no active problems to display for this patient.   Past Surgical History:  Procedure Laterality Date  . TONSILLECTOMY         Home Medications    Prior to Admission medications   Medication Sig Start Date End Date Taking? Authorizing Provider  ibuprofen (ADVIL) 800 MG tablet Take 1 tablet (800 mg total) by mouth every 8 (eight) hours as needed. 07/06/19   Sharion Balloon, NP    Family History History reviewed. No pertinent family history.  Social History Social History   Tobacco Use  . Smoking status: Current Every Day Smoker    Types: E-cigarettes  . Smokeless tobacco: Never Used  Substance Use Topics  . Alcohol use: Yes    Comment: occas.  . Drug use: Yes    Types: Marijuana    Comment: most days     Allergies   Patient has no known allergies.   Review of Systems Review of Systems  Constitutional: Negative for chills and fever.  HENT: Positive for sore throat. Negative for ear pain and trouble swallowing.   Eyes: Negative for pain and visual disturbance.  Respiratory: Negative for cough and shortness of breath.   Cardiovascular: Negative for chest pain and palpitations.  Gastrointestinal: Negative for abdominal pain and vomiting.  Genitourinary: Negative for dysuria and hematuria.  Musculoskeletal: Negative for arthralgias and back pain.  Skin:  Negative for color change and rash.  Neurological: Negative for seizures and syncope.  All other systems reviewed and are negative.    Physical Exam Triage Vital Signs ED Triage Vitals  Enc Vitals Group     BP      Pulse      Resp      Temp      Temp src      SpO2      Weight      Height      Head Circumference      Peak Flow      Pain Score      Pain Loc      Pain Edu?      Excl. in [place]?    No data found.  Updated Vital Signs BP 106/69 (BP Location: Left Arm)   Pulse 89   Temp 98.2 F (36.8 C) (Oral)   Resp 18   Wt 140 lb (63.5 kg)   SpO2 96%   BMI 21.29 kg/m   Visual Acuity Right Eye Distance:   Left Eye Distance:   Bilateral Distance:    Right Eye Near:   Left Eye Near:    Bilateral Near:     Physical Exam Vitals signs and nursing note reviewed.  Constitutional:      General: He is not in acute distress.    Appearance: He is well-developed. He is not ill-appearing.  Comments: Strong odor of marijuana.   HENT:     Head: Normocephalic and atraumatic.     Right Ear: Tympanic membrane normal.     Left Ear: Tympanic membrane normal.     Nose: Nose normal.     Mouth/Throat:     Mouth: Mucous membranes are moist.     Pharynx: Oropharynx is clear.     Comments: Normal speech. Eyes:     Conjunctiva/sclera: Conjunctivae normal.  Neck:     Musculoskeletal: Neck supple.  Cardiovascular:     Rate and Rhythm: Normal rate and regular rhythm.     Heart sounds: No murmur.  Pulmonary:     Effort: Pulmonary effort is normal. No respiratory distress.     Breath sounds: Normal breath sounds. No stridor. No wheezing or rhonchi.  Abdominal:     General: Bowel sounds are normal.     Palpations: Abdomen is soft.     Tenderness: There is no abdominal tenderness. There is no guarding or rebound.  Skin:    General: Skin is warm and dry.     Findings: No rash.  Neurological:     Mental Status: He is alert.  Psychiatric:        Mood and Affect: Mood normal.         Behavior: Behavior normal.      UC Treatments / Results  Labs (all labs ordered are listed, but only abnormal results are displayed) Labs Reviewed  POCT RAPID STREP A (OFFICE) - Normal  NOVEL CORONAVIRUS, NAA  CULTURE, GROUP A STREP [facility])    EKG   Radiology No results found.  Procedures Procedures (including critical care time)  Medications Ordered in UC Medications - No data to display  Initial Impression / Assessment and Plan / UC Course  I have reviewed the triage vital signs and the nursing notes.  Pertinent labs & imaging results that were available during my care of the patient were reviewed by me and considered in my medical decision making (see chart for details).   Sore throat.  Instructed patient to go to the emergency department if he has difficulty swallowing or breathing.  Rapid strep negative; throat culture pending.  Treating with ibuprofen.  COVID test performed here.  Instructed patient to self quarantine until the test result is back.  Instructed patient to go to the emergency department if he develops high fever, shortness of breath, severe diarrhea, or other concerning symptoms.  Patient agrees with plan of care.   Final Clinical Impressions(s) / UC Diagnoses   Final diagnoses:  Sore throat     Discharge Instructions     Take the ibuprofen as directed.  Go to the emergency department if you have difficulty swallowing or breathing.    Your rapid strep test is negative.  A throat culture is pending; we will call you if it is positive requiring treatment.    Your COVID test is pending.  You should self quarantine until your test result is back and is negative.    Go to the emergency department if you develop high fever, shortness of breath, severe diarrhea, or other concerning symptoms.       ED Prescriptions    Medication Sig Dispense Auth. Provider   ibuprofen (ADVIL) 800 MG tablet Take 1 tablet (800 mg total) by mouth every 8 (eight)  hours as needed. 21 tablet Mickie Bail, NP     PDMP not reviewed this encounter.   Mickie Bail, NP 07/06/19 1422

## 2019-07-06 NOTE — ED Triage Notes (Signed)
Patient in office today requesting covid test states that his throat is uncomfortably. Feels like something is swollen in his throat   PHX:[facility] Denies pain,fever

## 2019-07-08 LAB — NOVEL CORONAVIRUS, NAA: SARS-CoV-2, NAA: NOT DETECTED

## 2019-07-09 LAB — CULTURE, GROUP A STREP (THRC)

## 2019-07-16 ENCOUNTER — Encounter: Payer: Self-pay | Admitting: Family Medicine

## 2019-07-16 ENCOUNTER — Ambulatory Visit: Payer: Self-pay | Admitting: Family Medicine

## 2019-07-16 ENCOUNTER — Other Ambulatory Visit: Payer: Self-pay

## 2019-07-16 DIAGNOSIS — F32A Depression, unspecified: Secondary | ICD-10-CM

## 2019-07-16 DIAGNOSIS — A549 Gonococcal infection, unspecified: Secondary | ICD-10-CM

## 2019-07-16 DIAGNOSIS — Z113 Encounter for screening for infections with a predominantly sexual mode of transmission: Secondary | ICD-10-CM

## 2019-07-16 DIAGNOSIS — F329 Major depressive disorder, single episode, unspecified: Secondary | ICD-10-CM

## 2019-07-16 DIAGNOSIS — F419 Anxiety disorder, unspecified: Secondary | ICD-10-CM

## 2019-07-16 LAB — GRAM STAIN

## 2019-07-16 MED ORDER — AZITHROMYCIN 500 MG PO TABS
1000.0000 mg | ORAL_TABLET | Freq: Once | ORAL | Status: AC
  Administered 2019-07-16: 1000 mg via ORAL

## 2019-07-16 MED ORDER — CEFTRIAXONE SODIUM 250 MG IJ SOLR
250.0000 mg | Freq: Once | INTRAMUSCULAR | Status: AC
  Administered 2019-07-16: 250 mg via INTRAMUSCULAR

## 2019-07-16 NOTE — Progress Notes (Signed)
STI clinic/screening visit  Subjective:  Billy Schmitt is a 23 y.o. male being seen today for an STI screening visit. The patient reports they do have symptoms.   Patient has the following medical conditions:  There are no active problems to display for this patient.   Chief Complaint  Patient presents with  . SEXUALLY TRANSMITTED DISEASE    STD testing, declined bloodwork    HPI  Patient reports would like STI testing. Has had burning with urination since this morning. See flowsheet for further details and programmatic requirements.    The following portions of the patient's history were reviewed and updated as appropriate: allergies, current medications, past medical history, past social history, past surgical history and problem list.  Objective:  There were no vitals filed for this visit.  Physical Exam Constitutional:      Appearance: Normal appearance.  HENT:     Head: Normocephalic and atraumatic.     Comments: No nits or hair loss    Mouth/Throat:     Mouth: Mucous membranes are moist.     Pharynx: Oropharynx is clear. No oropharyngeal exudate or posterior oropharyngeal erythema.  Pulmonary:     Effort: Pulmonary effort is normal.  Abdominal:     General: Abdomen is flat.     Palpations: Abdomen is soft. There is no hepatomegaly or mass.     Tenderness: There is no abdominal tenderness.  Genitourinary:    Pubic Area: No rash or pubic lice.      Penis: Circumcised. Erythema, tenderness and discharge present.      Scrotum/Testes: Normal.     Epididymis:     Right: Normal.     Left: Normal.     Rectum: Normal.  Lymphadenopathy:     Head:     Right side of head: No preauricular or posterior auricular adenopathy.     Left side of head: No preauricular or posterior auricular adenopathy.     Cervical: No cervical adenopathy.     Upper Body:     Right upper body: No supraclavicular or axillary adenopathy.     Left upper body: No supraclavicular or axillary  adenopathy.     Lower Body: Right inguinal adenopathy present. Left inguinal adenopathy present.  Skin:    General: Skin is warm and dry.     Findings: No rash.  Neurological:     Mental Status: He is alert and oriented to person, place, and time.       Assessment and Plan:  Billy Schmitt is a 23 y.o. male presenting to the [facility] Department for STI screening  1. Screening examination for venereal disease -Screenings today as below. Treat gram stain per standing order -Patient does meet criteria for HepB, HepC Screening. Declines these screenings, also declines HIV and syphilis screenings. -Counseled on warning s/sx and when to seek care. Recommended condom use with all sex. - Gram stain - Gonococcus culture  2. Anxiety and depression -Upon flowsheet questioning pt endorses anxiety. Declines formal referral to behavioral health but accepts card for Milton Ferguson, LCSW and states he may call.  3. Gonorrhea -Gram stain returned + for gonorrhea. Treatment today as below. Pt to RTC if vomits < 2 hr after taking medicine. -No sex for 7 days after both pt and partner completes treatment and encouraged condoms with all sex. - cefTRIAXone (ROCEPHIN) injection 250 mg - azithromycin (ZITHROMAX) tablet 1,000 mg   Return for screening as needed.  No future appointments.  Lisett Dirusso L  Jeffrey Graefe, PA-C

## 2019-07-16 NOTE — Progress Notes (Signed)
Gram stain reviewed, pt treated for GC per standing order. Pt accepted LCSW business card. Provider orders completed.

## 2020-02-10 ENCOUNTER — Ambulatory Visit: Payer: Self-pay

## 2020-03-22 ENCOUNTER — Encounter: Payer: Self-pay | Admitting: Family Medicine

## 2020-03-22 ENCOUNTER — Ambulatory Visit: Payer: Self-pay | Admitting: Family Medicine

## 2020-03-22 ENCOUNTER — Other Ambulatory Visit: Payer: Self-pay

## 2020-03-22 DIAGNOSIS — Z113 Encounter for screening for infections with a predominantly sexual mode of transmission: Secondary | ICD-10-CM

## 2020-03-22 LAB — GRAM STAIN

## 2020-03-22 NOTE — Progress Notes (Signed)
Gram Stain results reviewed. Per standing orders no treatment indicated. Loris Seelye, RN ° °

## 2020-03-22 NOTE — Progress Notes (Signed)
Here for STD screening.Nealy Karapetian Brewer-Jensen, RN 

## 2020-03-22 NOTE — Progress Notes (Signed)
   [facility] Department STI clinic/screening visit  Subjective:  Billy Schmitt is a 24 y.o. male being seen today for an STI screening visit. The patient reports they do have symptoms.    Patient has the following medical conditions:  There are no problems to display for this patient.    Chief Complaint  Patient presents with  . Exposure to STD    HPI  Patient reports that his groin lymph nodes feel larger than normal.  He had unprotected sex with a male and he is concerned about this symptom.  He denies other STD symptoms.   See flowsheet for further details and programmatic requirements.    The following portions of the patient's history were reviewed and updated as appropriate: allergies, current medications, past medical history, past social history, past surgical history and problem list.  Objective:  There were no vitals filed for this visit.  Physical Exam Constitutional:      Appearance: Normal appearance.  HENT:     Head: Normocephalic and atraumatic.     Comments: No nits or hair loss    Mouth/Throat:     Mouth: Mucous membranes are moist.     Pharynx: Oropharynx is clear. No oropharyngeal exudate or posterior oropharyngeal erythema.  Pulmonary:     Effort: Pulmonary effort is normal.  Abdominal:     General: Abdomen is flat.     Palpations: Abdomen is soft. There is no hepatomegaly or mass.     Tenderness: There is no abdominal tenderness.  Genitourinary:    Pubic Area: No rash or pubic lice.      Penis: Normal.      Testes: Normal.     Epididymis:     Right: Normal.     Left: Normal.     Rectum: Normal.  Lymphadenopathy:     Head:     Right side of head: No preauricular or posterior auricular adenopathy.     Left side of head: No preauricular or posterior auricular adenopathy.     Cervical: No cervical adenopathy.     Upper Body:     Right upper body: No supraclavicular or axillary adenopathy.     Left upper body: No  supraclavicular or axillary adenopathy.     Lower Body: No right inguinal adenopathy. No left inguinal adenopathy.  Skin:    General: Skin is warm and dry.     Findings: No rash.  Neurological:     Mental Status: He is alert and oriented to person, place, and time.    Assessment and Plan:  Billy Schmitt is a 24 y.o. male presenting to the [facility] Department for STI screening  1. Screening examination for venereal disease  - Gram stain-negative - Gonococcus culture Client declines blood work. Co to use condoms for STD prevention.    No follow-ups on file.  No future appointments.  Larene Pickett, FNP

## 2020-03-27 LAB — GONOCOCCUS CULTURE

## 2021-08-22 ENCOUNTER — Emergency Department
Admission: EM | Admit: 2021-08-22 | Discharge: 2021-08-22 | Disposition: A | Discharge status: Home / Self Care | Payer: Self-pay | Attending: Student in an Organized Health Care Education/Training Program | Admitting: Student in an Organized Health Care Education/Training Program

## 2021-08-22 ENCOUNTER — Encounter: Payer: Self-pay | Admitting: Emergency Medicine

## 2021-08-22 ENCOUNTER — Other Ambulatory Visit: Payer: Self-pay

## 2021-08-22 ENCOUNTER — Emergency Department: Payer: Self-pay

## 2021-08-22 DIAGNOSIS — R519 Headache, unspecified: Secondary | ICD-10-CM | POA: Insufficient documentation

## 2021-08-22 MED ORDER — KETOROLAC TROMETHAMINE 30 MG/ML IJ SOLN
30.0000 mg | Freq: Once | INTRAMUSCULAR | Status: AC
  Administered 2021-08-22: 30 mg via INTRAMUSCULAR
  Filled 2021-08-22: qty 1

## 2021-08-22 MED ORDER — FLUTICASONE PROPIONATE 50 MCG/ACT NA SUSP: 2.0000 | Freq: Every day | NASAL | 20 refills | Status: AC

## 2021-08-22 NOTE — ED Provider Notes (Signed)
[facility] Provider Note    Event Date/Time   First MD Initiated Contact with Patient 08/22/21 1454     (approximate)   History   Headache   HPI  Billy Schmitt is a 26 y.o. male   is to the ED with complaint of severe headache for 7 days.  Patient states he has a history of "migraines" but has never been worked up for them.  Patient has taken over-the-counter medication without any relief.  He states that there has been nausea without vomiting.  Headache is localized to the right side of his head "behind his right".  Patient in the past has taken Excedrin with relief making this headache different.  He denies any recent injury to his head.  Patient admits to e-cigarettes but denies any medical history.  Family history does include his father having a stroke at a "very young age".  Currently he rates his pain as an 8 out of 10 but at times states that the headache is more like an 11 out of 10.      Physical Exam   Triage Vital Signs: ED Triage Vitals  Enc Vitals Group     BP 08/22/21 1412 124/85     Pulse Rate 08/22/21 1412 61     Resp 08/22/21 1412 20     Temp 08/22/21 1412 97.8 F (36.6 C)     Temp Source 08/22/21 1412 Oral     SpO2 08/22/21 1412 98 %     Weight 08/22/21 1410 135 lb (61.2 kg)     Height 08/22/21 1410 5\' 9"  (1.753 m)     Head Circumference --      Peak Flow --      Pain Score 08/22/21 1415 8     Pain Loc --      Pain Edu? --      Excl. in [place]? --     Most recent vital signs: Vitals:   08/22/21 1412 08/22/21 1733  BP: 124/85 122/78  Pulse: 61 62  Resp: 20 16  Temp: 97.8 F (36.6 C)   SpO2: 98% 100%     General: Awake, no distress.  Talkative, oriented, no photophobia noted. CV:  Good peripheral perfusion.  Heart regular rate and rhythm without murmur. Resp:  Normal effort.  Lungs are clear bilaterally. Abd:  No distention.  Other:  Cranial nerves II through XII grossly intact.  PERRLA, EOMI, conjunctive a clear  bilaterally.  Speech is normal.  No tenderness on palpation of the cervical spine posteriorly and range of motion is within normal limits.  Good muscle strength bilaterally.  Patient is able to ambulate without any assistance.   ED Results / Procedures / Treatments   Labs (all labs ordered are listed, but only abnormal results are displayed) Labs Reviewed - No data to display   RADIOLOGY Radiologist report was reviewed by myself for CT head without contrast.  No acute intracranial abnormalities.  Chronic sinus issues were noted on CT.  Right frontal and ethmoid sinuses appear to be chronic.  PROCEDURES:  Critical Care performed: No  Procedures   MEDICATIONS ORDERED IN ED: Medications  ketorolac (TORADOL) 30 MG/ML injection 30 mg (30 mg Intramuscular Given 08/22/21 1733)     IMPRESSION / MDM / ASSESSMENT AND PLAN / ED COURSE  I reviewed the triage vital signs and the nursing notes.  Differential diagnosis includes, but is not limited to, headache disorder, recurrent headache, right frontal sinusitis, intracranial changes to suggest reasons for headaches.   26 year old male presents to the ED with complaint of right-sided headache for the last 7 days.  Patient initially had some nausea without vomiting that has resolved at this time.  Patient denies any photophobia.  He has taken Excedrin migraine without any relief.  He states that he has infrequently had headaches since he was young and was told that they were "migraines".  He is concerned as his father had a CVA at an early age.  He has not had any imaging previous to this visit.  CT head was reassuring and patient was made aware that there was no intracranial changes.  An injection of Toradol 30 mg IM was given while in the ED as he drove himself to the emergency department.  We also discussed findings of his chronic sinus issues and he is agreeable to try Flonase 2 sprays each nostril daily to see if  this helps with the pressure sensation.  He was given list of clinics in the area charge per income on a sliding scale and also [facility] which [facility].       FINAL CLINICAL IMPRESSION(S) / ED DIAGNOSES   Final diagnoses:  Headache disorder     Rx / DC Orders   ED Discharge Orders          Ordered    fluticasone (FLONASE) 50 MCG/ACT nasal spray  Daily        08/22/21 1643             Note:  This document was prepared using Dragon voice recognition software and may include unintentional dictation errors.   Tommi Rumps, PA-C 08/22/21 Lanny Cramp, MD 08/23/21 1027

## 2021-08-22 NOTE — Discharge Instructions (Signed)
Call the [facility] to see if they are taking new patients.  This is a clinic that does not charge and is available to you for further evaluation of your headaches if needed.  Also the [facility], [employer], [employer], [place] [facility] are also clinics that charge based on your income.  Call these offices as well to get established as a patient so that you will have a primary care provider.  Use the Flonase nasal spray 2 sprays to each nostril daily to help with your sinus pressure.  Your CT today ruled out any brain lesions or evidence of stroke.  You may still require more medical evaluation if your headaches continue or worsen.

## 2021-08-22 NOTE — ED Triage Notes (Signed)
Pt here with a severe headache for a week. Pt has a hx of migraines and he assumed that's what his issue was. Pt able to look at light with no issue but has pain in the back of his right eye and head. Pt in NAD in triage.

## 2021-10-30 ENCOUNTER — Encounter: Payer: Self-pay | Admitting: Nurse Practitioner

## 2021-10-30 ENCOUNTER — Ambulatory Visit: Payer: Self-pay | Admitting: Nurse Practitioner

## 2021-10-30 ENCOUNTER — Other Ambulatory Visit: Payer: Self-pay

## 2021-10-30 DIAGNOSIS — Z113 Encounter for screening for infections with a predominantly sexual mode of transmission: Secondary | ICD-10-CM

## 2021-10-30 LAB — HM HIV SCREENING LAB: HM HIV Screening: NEGATIVE

## 2021-10-30 LAB — HEPATITIS B SURFACE ANTIGEN: Hepatitis B Surface Ag: NONREACTIVE

## 2021-10-30 LAB — GRAM STAIN

## 2021-10-30 LAB — HM HEPATITIS C SCREENING LAB: HM Hepatitis Screen: NEGATIVE

## 2021-10-30 NOTE — Progress Notes (Signed)
[facility] Department ?STI clinic/screening visit ? ?Subjective:  ?Billy Schmitt is a 26 y.o. male being seen today for an STI screening visit. The patient reports they do have symptoms.   ? ?Patient has the following medical conditions:  There are no problems to display for this patient. ? ? ? ?Chief Complaint  ?Patient presents with  ? SEXUALLY TRANSMITTED DISEASE  ? ? ?HPI ? ?Patient reports to clinic today for STD screening.  Patient reports that 5 days ago she noticed a small bump to her pubic area and the shaft of his penis.   ? ?Does the patient or their partner desires a pregnancy in the next year? No ? ?Screening for MPX risk: ?Does the patient have an unexplained rash? No ?Is the patient MSM? No ?Does the patient endorse multiple sex partners or anonymous sex partners? No ?Did the patient have close or sexual contact with a person diagnosed with MPX? No ?Has the patient traveled outside the [place] where MPX is endemic? No ?Is there a high clinical suspicion for MPX-- evidenced by one of the following No ? -Unlikely to be chickenpox ? -Lymphadenopathy ? -Rash that present in same phase of evolution on any given body part ? ? ?See flowsheet for further details and programmatic requirements.  ? ? ?The following portions of the patient's history were reviewed and updated as appropriate: allergies, current medications, past medical history, past social history, past surgical history and problem list. ? ?Objective:  ?There were no vitals filed for this visit. ? ?Physical Exam ?Constitutional:   ?   Appearance: Normal appearance.  ?HENT:  ?   Head: Normocephalic.  ?   Right Ear: External ear normal.  ?   Left Ear: External ear normal.  ?   Nose: Nose normal.  ?   Mouth/Throat:  ?   Mouth: Mucous membranes are moist.  ?   Comments: Dental caries noted  ?Pulmonary:  ?   Effort: Pulmonary effort is normal.  ?Abdominal:  ?   General: Abdomen is flat.  ?   Palpations: Abdomen is soft.  ?Genitourinary: ?    Penis: Circumcised.   ?   Comments: 0.2 x 0.2 cm tender papule in pubic area.  0.3 x 0.2 cm non-tender macules to shaft of penis.   ?Musculoskeletal:  ?   Cervical back: Full passive range of motion without pain, normal range of motion and neck supple.  ?Skin: ?   General: Skin is warm and dry.  ?   Comments: Flesh color papule noted to left index finger.   ?Neurological:  ?   Mental Status: He is alert and oriented to person, place, and time.  ?Psychiatric:     ?   Attention and Perception: Attention normal.     ?   Mood and Affect: Mood normal.     ?   Speech: Speech normal.     ?   Behavior: Behavior is cooperative.  ? ? ? ? ?Assessment and Plan:  ?Billy Schmitt is a 26 y.o. male presenting to the [facility] Department for STI screening ? ?1. Screening examination for venereal disease ?-26 year old male in clinic today for STD screening. ?-Macule and papule lesions noted to pubic are and shaft of penis will collect sample for HSV and send for blood work of Syphilis.  Advised to return to clinic if any additional lesion appear or symptoms get worse.  Also recommended over the counter compound wart removal to be  applied to index finger.  ?-Patient does have STI symptoms ?Patient accepted all screenings including  oral, urine GC and bloodwork for HIV/RPR, HBV/HCV ?Patient meets criteria for HepB screening? Yes. Ordered? Yes ?Patient meets criteria for HepC screening? Yes. Ordered? Yes ? ?Recommended condom use with all sex.  Advised no sex until test results are received.   ?Discussed importance of condom use for STI prevent ? ?Treat gram stain per standing order ?Discussed time line for [facility] results and that patient will be called with positive results and encouraged patient to call if he had not heard in 2 weeks ?Recommended returning for continued or worsening symptoms.   ? ?- Gonococcus culture ?- Gram stain ?- Gonococcus culture ?- Virology, [facility] ?- HIV/HCV [place] [facility] ?- Syphilis  Serology, [place] [facility] ?- HBV Antigen/Antibody [facility]  ? ?Return if symptoms worsen or fail to improve. ? ? ? ?Glenna Fellows, FNP ?

## 2021-11-01 ENCOUNTER — Telehealth: Payer: Self-pay

## 2021-11-01 NOTE — Telephone Encounter (Signed)
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   ? ?Phone call to [phone].  Voicemail not set up, unable to leave message. Tried twice. ? ?MyChart message sent. ?

## 2021-11-01 NOTE — Telephone Encounter (Signed)
Phone call received from pt. Pt confirmed password from last visit. Counseled pt regarding positive HSV-2 result and answered some general questions.  Pt states he already has appt with [facility] provider on 11/06/21 and will discuss tx options further at that visit. ?

## 2021-11-03 LAB — GONOCOCCUS CULTURE

## 2021-11-06 ENCOUNTER — Encounter: Payer: Self-pay | Admitting: Nurse Practitioner

## 2021-11-06 ENCOUNTER — Ambulatory Visit: Payer: Self-pay | Admitting: Nurse Practitioner

## 2021-11-06 ENCOUNTER — Other Ambulatory Visit: Payer: Self-pay

## 2021-11-06 DIAGNOSIS — B009 Herpesviral infection, unspecified: Secondary | ICD-10-CM

## 2021-11-06 MED ORDER — ACYCLOVIR 400 MG PO TABS: 400.0000 mg | ORAL_TABLET | Freq: Two times a day (BID) | ORAL | 0 refills | Status: AC

## 2021-11-06 NOTE — Progress Notes (Signed)
S: Patient in clinic for HSV medication.  Patient was in clinic for STD testing on 11/01/21 where cultures were collected.   Lesions noted on shaft  of penis and pubic area.   ? ?O: + HSV 2  ?  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  Dispense: 240 tablet for 120 days; Refill: 0 ?  ?2. Sexually transmitted disease counseling ?Discussed with patient about the need for annual visits with provider to get refills.  ?Declined need for other STI testing today.   ?-Patient to return to clinic for prescription refill to desired pharmacy.   ?  ?The patient was dispensed Acyclovir today. I provided counseling today regarding the medication. We discussed the medication, the side effects and when to call clinic. Patient given the opportunity to ask questions. Questions answered.   ? ? Glenna Fellows, FNP  ?

## 2021-11-10 NOTE — Progress Notes (Signed)
Chart reviewed by Pharmacist  Suzanne Walker PharmD, Contract Pharmacist at [facility]  

## 2022-09-15 ENCOUNTER — Emergency Department
Admission: EM | Admit: 2022-09-15 | Discharge: 2022-09-15 | Disposition: A | Discharge status: Home / Self Care | Payer: Self-pay | Attending: Emergency Medicine | Admitting: Emergency Medicine

## 2022-09-15 ENCOUNTER — Emergency Department: Payer: Self-pay

## 2022-09-15 ENCOUNTER — Other Ambulatory Visit: Payer: Self-pay

## 2022-09-15 ENCOUNTER — Encounter: Payer: Self-pay | Admitting: Emergency Medicine

## 2022-09-15 DIAGNOSIS — Y998 Other external cause status: Secondary | ICD-10-CM | POA: Insufficient documentation

## 2022-09-15 DIAGNOSIS — Y9389 Activity, other specified: Secondary | ICD-10-CM | POA: Insufficient documentation

## 2022-09-15 DIAGNOSIS — S86819A Strain of other muscle(s) and tendon(s) at lower leg level, unspecified leg, initial encounter: Secondary | ICD-10-CM | POA: Insufficient documentation

## 2022-09-15 DIAGNOSIS — T148XXA Other injury of unspecified body region, initial encounter: Secondary | ICD-10-CM

## 2022-09-15 DIAGNOSIS — X58XXXA Exposure to other specified factors, initial encounter: Secondary | ICD-10-CM | POA: Insufficient documentation

## 2022-09-15 DIAGNOSIS — Y9289 Other specified places as the place of occurrence of the external cause: Secondary | ICD-10-CM | POA: Insufficient documentation

## 2022-09-15 NOTE — Discharge Instructions (Signed)
Your ultrasound was negative for blood clot.  Please rest, ice, elevate the area.  Please remember to stay hydrated.  Please return for any new, worsening, or change in symptoms or other concerns.  It was a pleasure caring for you today.

## 2022-09-15 NOTE — ED Provider Notes (Signed)
[facility] Provider Note    Event Date/Time   First MD Initiated Contact with Patient 09/15/22 1551     (approximate)   History   Leg Pain   HPI  Billy Schmitt is a 27 y.o. male who presents today for evaluation of intermittent right calf discomfort.  Patient is very concerned because his father had a history of DVT and PE.  Patient reports that his symptoms have been intermittent for the past 5 years.  He also notes that his pain moves around to different extremities.  He has not noticed any swelling in any of his extremities.  No injury.  No numbness or tingling.  No weakness.  There are no problems to display for this patient.         Physical Exam   Triage Vital Signs: ED Triage Vitals  Enc Vitals Group     BP 09/15/22 1546 (!) 131/109     Pulse Rate 09/15/22 1546 79     Resp 09/15/22 1546 18     Temp 09/15/22 1546 98.1 F (36.7 C)     Temp Source 09/15/22 1546 Oral     SpO2 09/15/22 1546 100 %     Weight --      Height --      Head Circumference --      Peak Flow --      Pain Score 09/15/22 1545 0     Pain Loc --      Pain Edu? --      Excl. in [place]? --     Most recent vital signs: Vitals:   09/15/22 1546  BP: (!) 131/109  Pulse: 79  Resp: 18  Temp: 98.1 F (36.7 C)  SpO2: 100%    Physical Exam Vitals and nursing note reviewed.  Constitutional:      General: Awake and alert. No acute distress.    Appearance: Normal appearance. The patient is normal weight.  HENT:     Head: Normocephalic and atraumatic.     Mouth: Mucous membranes are moist.  Eyes:     General: PERRL. Normal EOMs        Right eye: No discharge.        Left eye: No discharge.     Conjunctiva/sclera: Conjunctivae normal.  Cardiovascular:     Rate and Rhythm: Normal rate and regular rhythm.     Pulses: Normal pulses.  Pulmonary:     Effort: Pulmonary effort is normal. No respiratory distress.     Breath sounds: Normal breath sounds.  Abdominal:      Abdomen is soft. There is no abdominal tenderness. No rebound or guarding. No distention. Musculoskeletal:        General: No swelling. Normal range of motion.     Cervical back: Normal range of motion and neck supple.  Skin:    General: Skin is warm and dry.     Capillary Refill: Capillary refill takes less than 2 seconds.     Findings: No rash.  Neurological:     Mental Status: The patient is awake and alert.      ED Results / Procedures / Treatments   Labs (all labs ordered are listed, but only abnormal results are displayed) Labs Reviewed - No data to display   EKG     RADIOLOGY I independently reviewed and interpreted imaging and agree with radiologists findings.     PROCEDURES:  Critical Care performed:   Procedures   MEDICATIONS ORDERED  IN ED: Medications - No data to display   IMPRESSION / MDM / [place] / ED COURSE  I reviewed the triage vital signs and the nursing notes.   Differential diagnosis includes, but is not limited to, muscle spasm, muscle strain, DVT, anxiety reaction.  Patient is awake and alert, hemodynamically stable and afebrile.  He has no pitting edema in any of his extremities.  He has full and normal range of motion of all extremities.  No rash or skin changes noted.  He reports that he has not been drinking much water, and has been getting intermittent cramps.  I recommended that he stay hydrated.  He did not want to check his electrolytes.  Given family history, ultrasound was obtained in triage.  Patient declined blood work.  Ultrasound obtained in triage was negative for DVT.  We discussed strict return precautions and outpatient follow-up.  Patient understands and agrees with plan.  He was discharged in stable condition.   Patient's presentation is most consistent with acute complicated illness / injury requiring diagnostic workup.      FINAL CLINICAL IMPRESSION(S) / ED DIAGNOSES   Final diagnoses:  Muscle  strain     Rx / DC Orders   ED Discharge Orders     None        Note:  This document was prepared using Dragon voice recognition software and may include unintentional dictation errors.   Emeline Gins 09/15/22 1709    Lavonia Drafts, MD 09/15/22 [phone]

## 2022-09-15 NOTE — ED Triage Notes (Signed)
Patient to ED via POV for right knee pain. Patient states concerned for blood clot. No history of same. No redness or swelling noted. Denies any pain at this time.

## 2022-12-17 IMAGING — CT CT HEAD W/O CM
4 series · 16 of 47 positions shown, 18 images · non-contrast
Comparison: None.

CLINICAL DATA: Severe headache, history migraines



[Series 2: head bone · axial · 0.42mm/px · z∈[-136,-106]mm · 3 of 77 slices shown]
[im 8/77  bone]
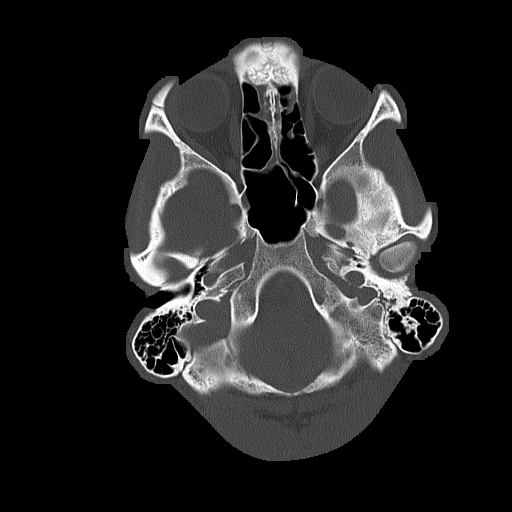
[im 16/77  bone]
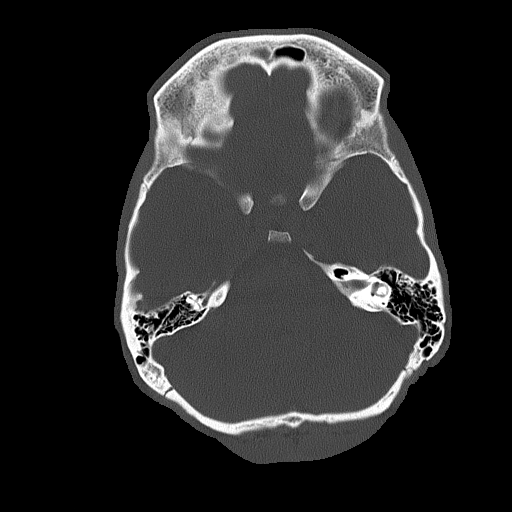
[im 23/77  bone]
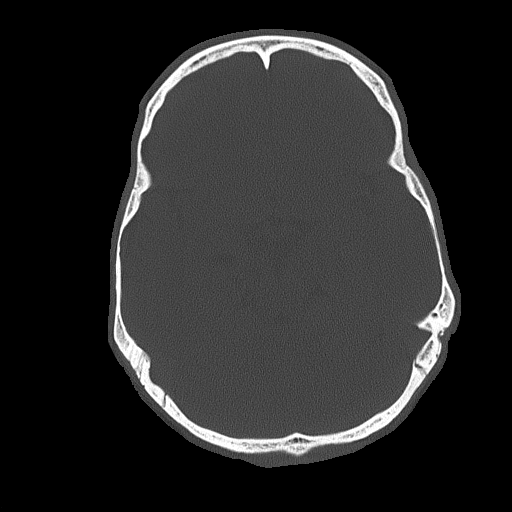

[Series 3: head wo · axial · 0.42mm/px · z∈[-135,-20]mm · 7 of 31 slices shown, 9 images]
[im 4/31  brain]
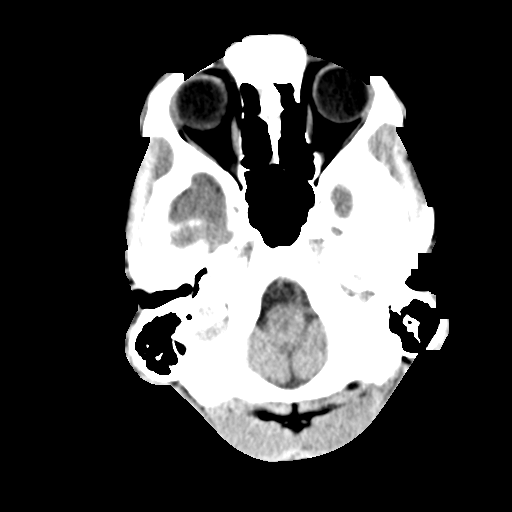
[im 4/31  bone]
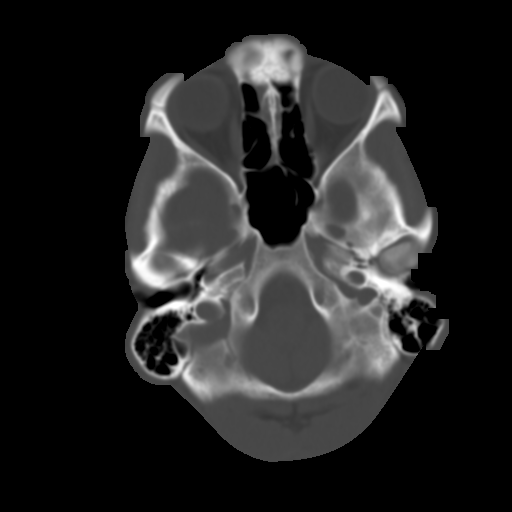
[im 8/31  brain]
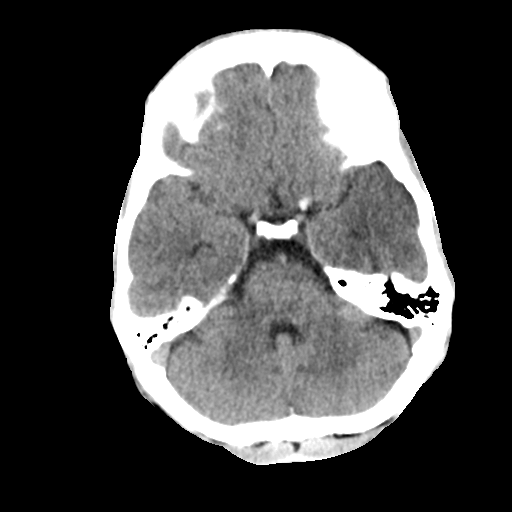
[im 12/31  brain]
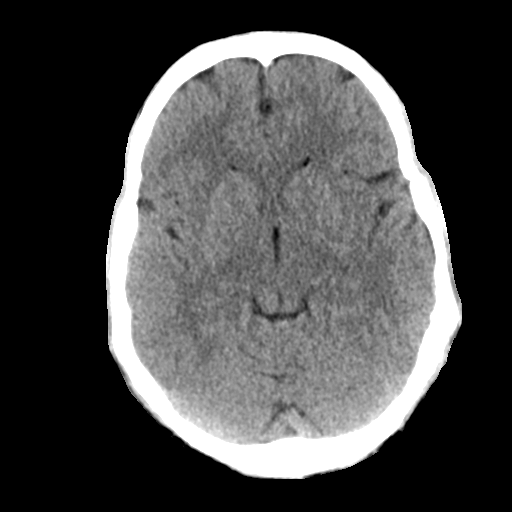
[im 16/31  brain]
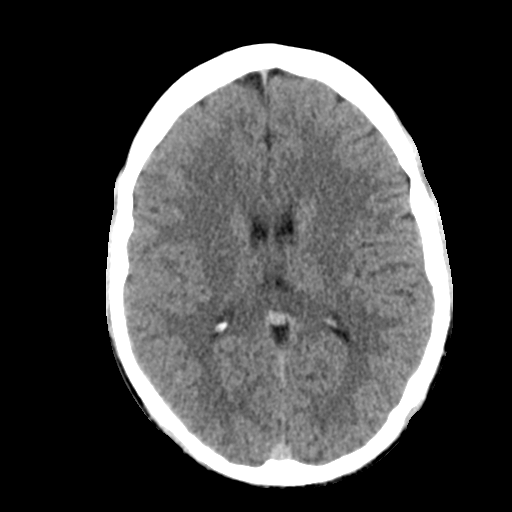
[im 19/31  brain]
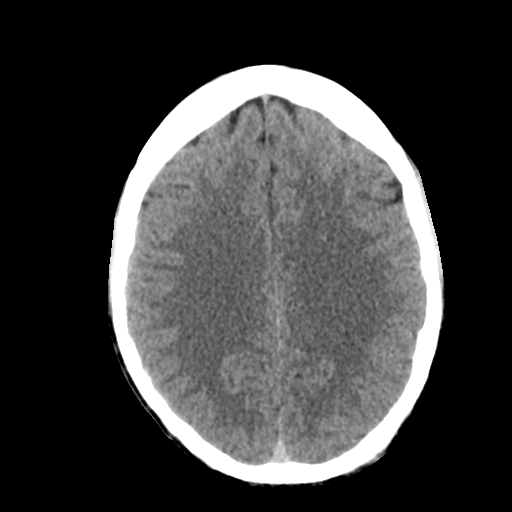
[im 19/31  bone]
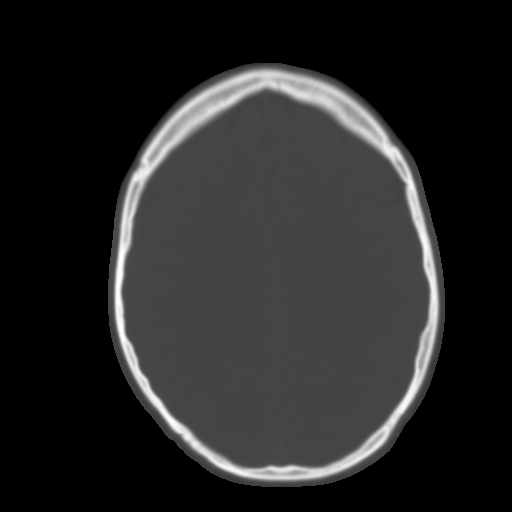
[im 23/31  brain]
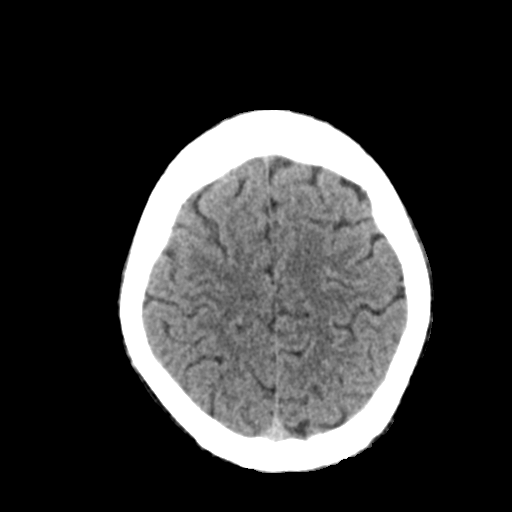
[im 27/31  brain]
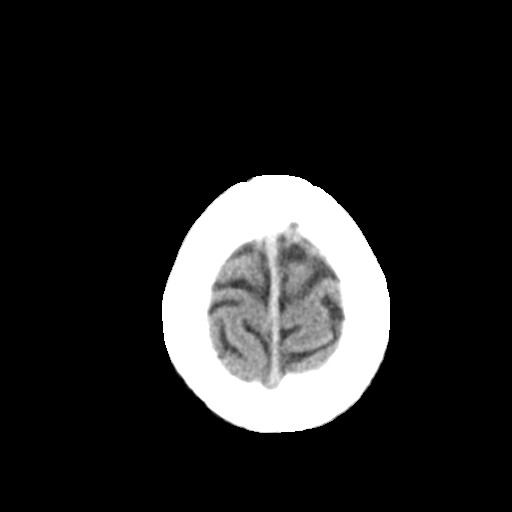

[Series 4: coronal soft tissue · coronal · 0.31mm/px · 3 of 66 slices shown]
[im 22/66  brain]
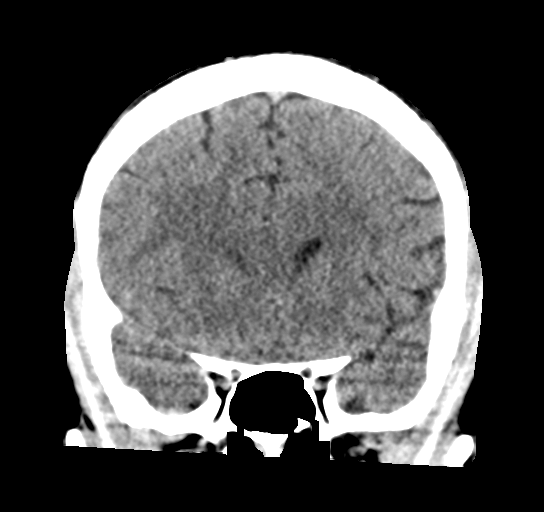
[im 29/66  brain]
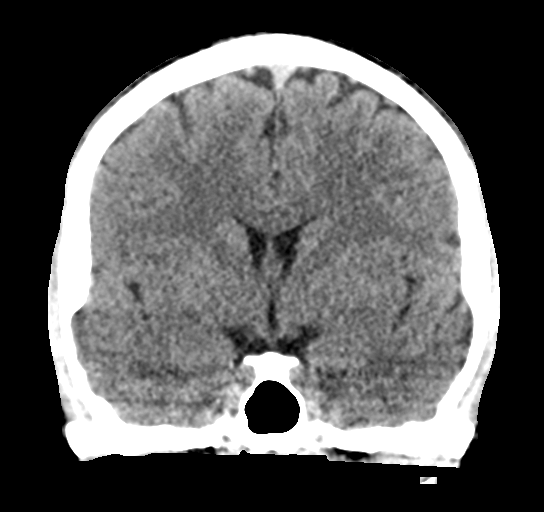
[im 37/66  brain]
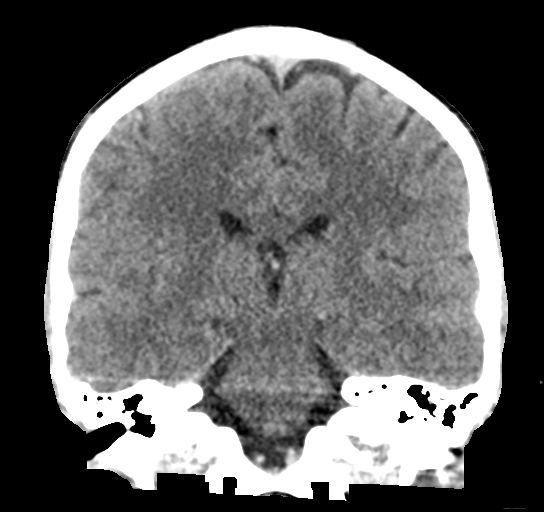

[Series 5: sagittal soft tissue · sagittal · 0.31mm/px · 3 of 57 slices shown]
[im 19/57  brain]
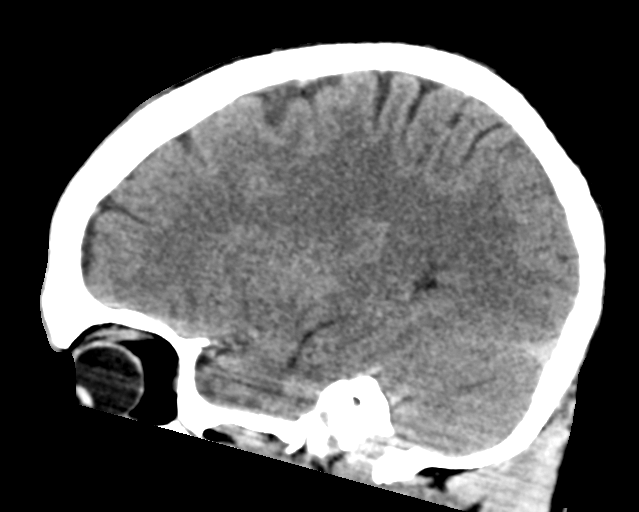
[im 29/57  brain]
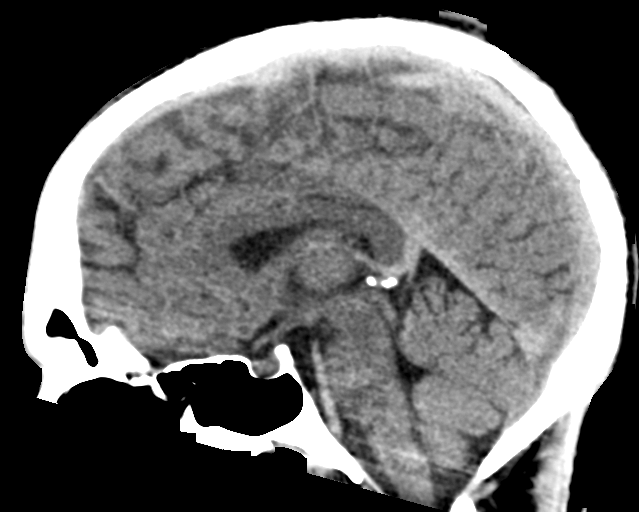
[im 38/57  brain]
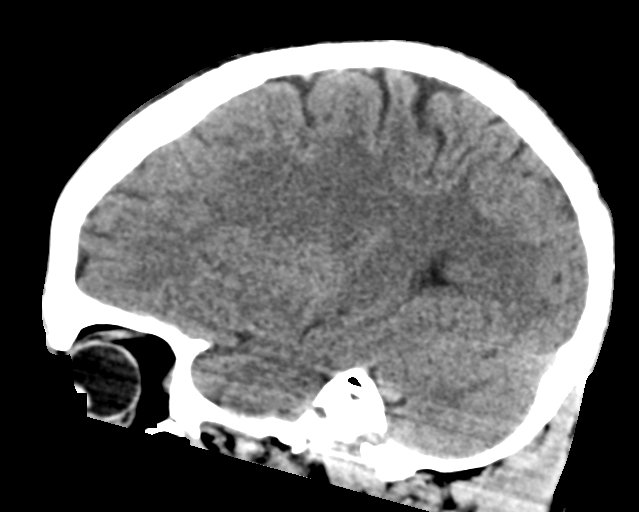

[16 of 47 positions shown; findings below may reference images not displayed]

FINDINGS: Brain: No evidence of acute infarction, hemorrhage, hydrocephalus,
extra-axial collection or mass lesion/mass effect.

Vascular: No hyperdense vessel or unexpected calcification.

Skull: Normal. Negative for fracture or focal lesion.

Sinuses/Orbits: Chronic right frontal and ethmoid sinus mucosal
thickening. Other sinuses and mastoids are clear. Orbits
unremarkable.

Other: None.
IMPRESSION: No acute intracranial abnormality by noncontrast CT.

Chronic right frontal and ethmoid sinus disease.

## 2023-02-12 ENCOUNTER — Emergency Department: Payer: Self-pay

## 2023-02-12 ENCOUNTER — Emergency Department
Admission: EM | Admit: 2023-02-12 | Discharge: 2023-02-12 | Disposition: A | Discharge status: Home / Self Care | Payer: Self-pay | Attending: Emergency Medicine | Admitting: Emergency Medicine

## 2023-02-12 ENCOUNTER — Other Ambulatory Visit: Payer: Self-pay

## 2023-02-12 DIAGNOSIS — S4992XA Unspecified injury of left shoulder and upper arm, initial encounter: Secondary | ICD-10-CM

## 2023-02-12 DIAGNOSIS — S5012XA Contusion of left forearm, initial encounter: Secondary | ICD-10-CM | POA: Insufficient documentation

## 2023-02-12 DIAGNOSIS — Y92838 Other recreation area as the place of occurrence of the external cause: Secondary | ICD-10-CM | POA: Insufficient documentation

## 2023-02-12 DIAGNOSIS — W19XXXA Unspecified fall, initial encounter: Secondary | ICD-10-CM | POA: Insufficient documentation

## 2023-02-12 NOTE — ED Triage Notes (Signed)
Pt sts that he fell a couple of days ago and is now having swelling to the lateral side of the left arm. Pt sts that it hurts to touch.

## 2023-02-12 NOTE — ED Provider Notes (Signed)
[facility] Emergency Department Provider Note     Event Date/Time   First MD Initiated Contact with Patient 02/12/23 1428     (approximate)   History   Arm Injury   HPI  Billy Schmitt is a 27 y.o. male with no significant past medical history who presents to ED for left arm pain x 2 days.  Patient reports he fell onto his left forearm at a friend's birthday party.  Increase in swelling.  Pain is worse with movement.  Pain score 4/6. Patient has tried ice with no relief.  Denies numbness and tingling.  No other complaints.      Physical Exam   Triage Vital Signs: ED Triage Vitals  Enc Vitals Group     BP 02/12/23 1404 (!) 132/99     Pulse Rate 02/12/23 1404 (!) 42     Resp 02/12/23 1404 17     Temp 02/12/23 1404 98 F (36.7 C)     Temp Source 02/12/23 1404 Oral     SpO2 02/12/23 1404 98 %     Weight 02/12/23 1405 124 lb (56.2 kg)     Height 02/12/23 1405 5\' 8"  (1.727 m)     Head Circumference --      Peak Flow --      Pain Score 02/12/23 1404 4     Pain Loc --      Pain Edu? --      Excl. in [place]? --     Most recent vital signs: Vitals:   02/12/23 1404  BP: (!) 132/99  Pulse: (!) 42  Resp: 17  Temp: 98 F (36.7 C)  SpO2: 98%    General Awake, no distress.  Comfortable. HEENT NCAT. PERRL. EOMI. No rhinorrhea. Mucous membranes are moist. CV:  Good peripheral perfusion.  RESP:  Normal effort.  ABD:  No distention.  Other:  Inspection of left forearm reveals mild edema and mild bruising over posterior forearm. no visible deformities.  There is a small abrasion covered with a Band-Aid.  Mild tenderness to palpation.  A ROM is full.  Neurovascular status intact throughout in median, ulnar, and radial nerve distribution.  Capillary refill normal and brisk.  ED Results / Procedures / Treatments   Labs (all labs ordered are listed, but only abnormal results are displayed) Labs Reviewed - No data to display  RADIOLOGY  I  personally viewed and evaluated these images as part of my medical decision making, as well as reviewing the written report by the radiologist.  ED Provider Interpretation: No evidence of fracture or dislocation as confirmed by imaging.   DG Forearm Left  Result Date: 02/12/2023 CLINICAL DATA:  Swelling, pain, fall EXAM: LEFT FOREARM - 2 VIEW COMPARISON:  None Available. FINDINGS: No acute fracture or dislocation. Soft tissue swelling about the mid forearm. IMPRESSION: No acute osseous abnormality. Soft tissue swelling about the mid forearm. Electronically Signed   By: Wiliam Ke M.D.   On: 02/12/2023 15:12    PROCEDURES:  Critical Care performed: No  Procedures   MEDICATIONS ORDERED IN ED: Medications - No data to display   IMPRESSION / MDM / ASSESSMENT AND PLAN / ED COURSE  I reviewed the triage vital signs and the nursing notes.                               27 y.o. male presents to the emergency department for  evaluation and treatment of left forearm pain with associated swelling following a fall 2 days ago. See HPI for further details.   Differential diagnosis includes, but is not limited to fracture, location, contusion, muscle strain.  Imaging ordered and reviewed.  Left forearm x-ray is reassuring.  Patient is neurovascularly intact.  He is in stable condition for discharge and outpatient follow-up if needed.  Is encouraged to apply treat with PRICE therapy and limit activity until decrease in swelling.  I instructed him to take Tylenol or ibuprofen for pain as needed.  He is to follow-up with orthopedics in a week if symptoms do not improve. Patient is given ED precautions to return to the ED for any worsening or new symptoms. Patient verbalizes understanding. All questions and concerns were addressed during ED visit.    Patient's presentation is most consistent with acute complicated illness / injury requiring diagnostic workup.  FINAL CLINICAL IMPRESSION(S) / ED DIAGNOSES    Final diagnoses:  Arm injury, left, initial encounter     Rx / DC Orders   ED Discharge Orders     None        Note:  This document was prepared using Dragon voice recognition software and may include unintentional dictation errors.    Romeo Apple, Renne Cornick A, PA-C 02/12/23 1552    Jene Every, MD 02/12/23 [phone]

## 2023-02-12 NOTE — Discharge Instructions (Addendum)
This condition is first treated with PRICE therapy. This includes protecting, resting, icing, adding pressure, and raising your injury. Limit your activity. Get plenty of rest .  Tylenol or ibuprofen for pain as needed.

## 2024-01-20 ENCOUNTER — Ambulatory Visit: Payer: Self-pay

## 2024-08-02 ENCOUNTER — Ambulatory Visit
Admission: EM | Admit: 2024-08-02 | Discharge: 2024-08-02 | Disposition: A | Discharge status: Home / Self Care | Attending: Internal Medicine | Admitting: Internal Medicine

## 2024-08-02 DIAGNOSIS — H6691 Otitis media, unspecified, right ear: Secondary | ICD-10-CM | POA: Diagnosis not present

## 2024-08-02 DIAGNOSIS — H9201 Otalgia, right ear: Secondary | ICD-10-CM

## 2024-08-02 DIAGNOSIS — H73011 Bullous myringitis, right ear: Secondary | ICD-10-CM | POA: Diagnosis not present

## 2024-08-02 MED ORDER — KETOROLAC TROMETHAMINE 30 MG/ML IJ SOLN
30.0000 mg | Freq: Once | INTRAMUSCULAR | Status: AC
  Administered 2024-08-02: 30 mg via INTRAMUSCULAR

## 2024-08-02 MED ORDER — AMOXICILLIN-POT CLAVULANATE 875-125 MG PO TABS: 1.0000 | ORAL_TABLET | Freq: Two times a day (BID) | ORAL | 0 refills | Status: AC

## 2024-08-02 NOTE — Discharge Instructions (Addendum)
 You have been prescribed Augmentin  for you ear. This is an antibiotic often used to treat ear infections. Please take as directed.  You were given an injection (Toradol ) for your pain today. This will help with the pain and inflammation. Avoid any further NSAIDs (ibuprofen , aleve, advil , motrin ) today given your injection. You can take tylenol today.   If no improvement, recommend you follow up with ENT.

## 2024-08-02 NOTE — ED Provider Notes (Signed)
 " [facility]  Note:  This document was prepared using Dragon voice recognition software and may include unintentional dictation errors.  MRN: [id number] DOB: June 10, 1996 DATE: 08/02/2024   Subjective:  Chief Complaint:  Chief Complaint  Patient presents with   Ear Pain     HPI: Billy GUIDICE is a 28 y.o. male presenting for right otalgia for the past 2 days. Patient states that he started with right otalgia yesterday. He states at first his right ear was popping and he slowly started noticing that his hearing was decreased. He reports by last night he was having pain in the right ear and difficulty hearing out of it. Reports no recent URI, but has had some congestion. Reports taking ibuprofen  last night and NyQuil with little relief. He had some nausea this morning, but no vomiting. Denies fever, vomiting, abdominal pain, cough. Endorses right otalgia, decreased hearing, nausea, congestion. Presents NAD.  Prior to Admission medications  Medication Sig Start Date End Date Taking? Authorizing Provider  acyclovir  (ZOVIRAX ) 400 MG tablet Take 1 tablet (400 mg total) by mouth 2 (two) times daily. 11/06/21   White, Ayo, NP  fluticasone  (FLONASE ) 50 MCG/ACT nasal spray Place 2 sprays into both nostrils daily. Patient not taking: Reported on 10/30/2021 08/22/21 08/22/22  Saunders Shona CROME, PA-C     Allergies[1]  History:   History reviewed. No pertinent past medical history.   Past Surgical History:  Procedure Laterality Date   TONSILLECTOMY      Family History  Problem Relation Age of Onset   Stroke Father    Liver cancer Maternal Grandfather     Social History[2]  Review of Systems  Constitutional:  Negative for fever.  HENT:  Positive for congestion, ear pain and hearing loss. Negative for ear discharge.   Gastrointestinal:  Positive for nausea. Negative for abdominal pain and vomiting.     Objective:   Vitals: BP 117/79 (BP Location: Left Arm)   Pulse 66   Temp  98.2 F (36.8 C) (Oral)   Resp 16   SpO2 96%   Physical Exam Constitutional:      General: He is not in acute distress.    Appearance: Normal appearance. He is well-developed and normal weight. He is not ill-appearing or toxic-appearing.  HENT:     Head: Normocephalic and atraumatic.     Right Ear: Tenderness present. No drainage. Tympanic membrane is erythematous.     Left Ear: Ear canal normal. A middle ear effusion is present.     Ears:     Comments: Right TM erythematous and opacified with bullae noted on the TM. TTP during exam. Cardiovascular:     Rate and Rhythm: Normal rate and regular rhythm.     Heart sounds: Normal heart sounds.  Pulmonary:     Effort: Pulmonary effort is normal.     Breath sounds: Normal breath sounds.     Comments: Clear to auscultation bilaterally  Abdominal:     General: Bowel sounds are normal.     Palpations: Abdomen is soft.     Tenderness: There is no abdominal tenderness.  Skin:    General: Skin is warm and dry.  Neurological:     General: No focal deficit present.     Mental Status: He is alert.  Psychiatric:        Mood and Affect: Mood and affect normal.     Results:  Labs: No results found for this or any previous visit (from the past 24  hours).  Radiology: No results found.   UC Course/Treatments:  Procedures: Procedures   Medications Ordered in UC: Medications  ketorolac  (TORADOL ) 30 MG/ML injection 30 mg (has no administration in time range)     Assessment and Plan :     ICD-10-CM   1. 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 Acute otalgia, right  H92.01     3. Right otitis media, unspecified otitis media type  H66.91      Bullous Myringitis of Right Ear Acute otalgia, right Right Otitis Media, unspecified otitis media Afebrile, nontoxic-appearing, NAD. VSS. DDX includes but not limited to: otitis media, otitis externa, eustachian tube dysfunction, cerumen impaction Right TM erythematous and  opacified with bullae. Suspect bullous myringitis. Augmentin  875mg  BID was prescribed. Toradol  30mg  IM was given today in office for pain. Avoid NSAIDs for the rest of the day given injection in office. Strict ED precautions were given and patient verbalized understanding.  ED Discharge Orders          Ordered    amoxicillin -clavulanate (AUGMENTIN ) 875-125 MG tablet  Every 12 hours        08/02/24 0937             PDMP not reviewed this encounter.      [1] No Known Allergies [2]  Social History Tobacco Use   Smoking status: Some Days    Types: E-cigarettes   Smokeless tobacco: Never   Tobacco comments:    Uses ketamine  Substance Use Topics   Alcohol use: Yes    Alcohol/week: 1.0 standard drink of alcohol    Types: 1 Cans of beer per week    Comment: occassionally   Drug use: Yes    Types: Marijuana    Comment: daily     Basilia Ulanda SQUIBB, PA-C 08/02/24 0943  "

## 2024-08-02 NOTE — ED Triage Notes (Signed)
 Patient presents to the office for right ear pain and pressure x 2 days.
